# Patient Record
Sex: Female | Born: 1990 | Race: White | Marital: Married
Health system: Southern US, Community
[De-identification: ages and names within clinical notes are randomized; demographics above are authoritative.]

## PROBLEM LIST (undated history)

## (undated) DIAGNOSIS — J45909 Unspecified asthma, uncomplicated: Secondary | ICD-10-CM

## (undated) DIAGNOSIS — Z9889 Other specified postprocedural states: Secondary | ICD-10-CM

## (undated) HISTORY — PX: TYMPANOSTOMY TUBE PLACEMENT: SHX32

## (undated) HISTORY — PX: WISDOM TOOTH EXTRACTION: SHX21

---
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
## 2008-08-21 ENCOUNTER — Encounter: Admission: RE | Admit: 2008-08-21 | Discharge: 2008-08-21 | Payer: Self-pay | Admitting: Sports Medicine

## 2015-12-03 DIAGNOSIS — J302 Other seasonal allergic rhinitis: Secondary | ICD-10-CM | POA: Insufficient documentation

## 2015-12-03 DIAGNOSIS — H902 Conductive hearing loss, unspecified: Secondary | ICD-10-CM | POA: Insufficient documentation

## 2015-12-03 DIAGNOSIS — H6983 Other specified disorders of Eustachian tube, bilateral: Secondary | ICD-10-CM | POA: Insufficient documentation

## 2015-12-24 DIAGNOSIS — H7292 Unspecified perforation of tympanic membrane, left ear: Secondary | ICD-10-CM | POA: Insufficient documentation

## 2016-01-31 ENCOUNTER — Institutional Professional Consult (permissible substitution): Payer: BLUE CROSS/BLUE SHIELD | Admitting: Neurology

## 2016-03-15 DIAGNOSIS — Z6825 Body mass index (BMI) 25.0-25.9, adult: Secondary | ICD-10-CM | POA: Diagnosis not present

## 2016-03-15 DIAGNOSIS — R87612 Low grade squamous intraepithelial lesion on cytologic smear of cervix (LGSIL): Secondary | ICD-10-CM | POA: Diagnosis not present

## 2016-03-15 DIAGNOSIS — Z113 Encounter for screening for infections with a predominantly sexual mode of transmission: Secondary | ICD-10-CM | POA: Diagnosis not present

## 2016-03-15 DIAGNOSIS — Z01419 Encounter for gynecological examination (general) (routine) without abnormal findings: Secondary | ICD-10-CM | POA: Diagnosis not present

## 2016-05-12 DIAGNOSIS — N72 Inflammatory disease of cervix uteri: Secondary | ICD-10-CM | POA: Diagnosis not present

## 2016-05-12 DIAGNOSIS — R87612 Low grade squamous intraepithelial lesion on cytologic smear of cervix (LGSIL): Secondary | ICD-10-CM | POA: Diagnosis not present

## 2016-05-12 DIAGNOSIS — B977 Papillomavirus as the cause of diseases classified elsewhere: Secondary | ICD-10-CM | POA: Diagnosis not present

## 2016-06-13 DIAGNOSIS — J020 Streptococcal pharyngitis: Secondary | ICD-10-CM | POA: Diagnosis not present

## 2017-03-14 ENCOUNTER — Encounter: Payer: Self-pay | Admitting: Adult Health

## 2017-03-14 ENCOUNTER — Ambulatory Visit (INDEPENDENT_AMBULATORY_CARE_PROVIDER_SITE_OTHER): Payer: BLUE CROSS/BLUE SHIELD | Admitting: Adult Health

## 2017-03-14 DIAGNOSIS — Z0000 Encounter for general adult medical examination without abnormal findings: Secondary | ICD-10-CM | POA: Diagnosis not present

## 2017-03-14 DIAGNOSIS — H6690 Otitis media, unspecified, unspecified ear: Secondary | ICD-10-CM | POA: Insufficient documentation

## 2017-03-14 DIAGNOSIS — B029 Zoster without complications: Secondary | ICD-10-CM | POA: Diagnosis not present

## 2017-03-14 MED ORDER — ONDANSETRON 8 MG PO TBDP: 8.0000 mg | ORAL_TABLET | Freq: Three times a day (TID) | ORAL | 0 refills | Status: DC | PRN

## 2017-03-14 MED ORDER — AMOXICILLIN-POT CLAVULANATE 875-125 MG PO TABS: 1.0000 | ORAL_TABLET | Freq: Two times a day (BID) | ORAL | 0 refills | Status: DC

## 2017-03-14 MED ORDER — GABAPENTIN 300 MG PO CAPS: 300.0000 mg | ORAL_CAPSULE | Freq: Two times a day (BID) | ORAL | 1 refills | Status: DC

## 2017-03-14 NOTE — Assessment & Plan Note (Signed)
Gabapentin 300mg  BID PRN for zoster pain. Keep site clean/dry. Discussed s/s of infection-instructed to call clinic if any are noted.

## 2017-03-14 NOTE — Assessment & Plan Note (Addendum)
Need to schedule CPE with fasting labs. Have requested records from previous PCP.

## 2017-03-14 NOTE — Progress Notes (Signed)
Subjective:    Patient ID: Anna [name], female    DOB: 25-Feb-1991, 26 y.o.   MRN: [id number]  HPI:  Anna [name] is here to establish as a new pt.  She is a very pleasant 26 year old female.  PMH: Recurrent AOM, recurrent shingles outbreak.  Both recently began over the weekend.  Left ear pain 7/10, constant dull ache that is worsening.  Herpes zoster vesicles erupted sat night and have stay localized above OD, pain is rated 3/10 and described as "electric pain".  She did have chicken pox as a child and first herpes zoster outbreak occurred during in middle school, then again at age 26.  Since then she has had herpes zoster outbreak 1-2 times/year. She denies hx of immunosupresssive disease or family hx of similar experience. 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  She denies tobacco use or excessive ETOH use.  Patient Care Team    Relationship Specialty Notifications Start End  Anna [name], Anna Ege D, NP PCP - General Family Medicine  03/14/17     Patient Active Problem List   Diagnosis Date Noted  . Health care maintenance 03/14/2017  . Shingles outbreak 03/14/2017  . AOM (acute otitis media) 03/14/2017     No past medical history on file.   No past surgical history on file.   No family history on file.   History  Drug use: Unknown     History  Alcohol use Not on file     History  Smoking Status  . Not on file  Smokeless Tobacco  . Not on file     Outpatient Encounter Prescriptions as of 03/14/2017  Medication Sig  . amoxicillin-clavulanate (AUGMENTIN) 875-125 MG tablet Take 1 tablet by mouth 2 (two) times daily.  Marland Kitchen. gabapentin (NEURONTIN) 300 MG capsule Take 1 capsule (300 mg total) by mouth 2 (two) times daily.  Colleen Can. JUNEL FE 1/20 1-20 MG-MCG tablet Take 1 tablet by mouth daily.  . ondansetron (ZOFRAN-ODT) 8 MG disintegrating tablet Take 1 tablet (8 mg total) by mouth every 8  (eight) hours as needed for nausea or vomiting.   No facility-administered encounter medications on file as of 03/14/2017.     Allergies: Patient has no known allergies.  Body mass index is 25.63 kg/m.  Blood pressure 103/68, pulse 69, height 5\' 4"  (1.626 m), weight 149 lb 4.8 oz (67.7 kg), last menstrual period 02/21/2017.       Review of Systems  Constitutional: Negative for activity change, appetite change, chills, diaphoresis, fatigue, fever and unexpected weight change.  HENT: Positive for ear pain. Negative for congestion, ear discharge, postnasal drip and voice change.   Eyes: Negative for visual disturbance.  Respiratory: Negative for cough, chest tightness, shortness of breath and stridor.   Cardiovascular: Negative for chest pain, palpitations and leg swelling.  Gastrointestinal: Negative for abdominal distention, abdominal pain, anal bleeding, constipation, diarrhea, nausea and vomiting.       Reports nausea when taking Augmentin   Endocrine: Negative for cold intolerance, heat intolerance, polydipsia, polyphagia and polyuria.  Genitourinary: Negative for difficulty urinating.  Musculoskeletal: Negative for arthralgias, back pain, gait problem, joint swelling, myalgias, neck pain and neck stiffness.  Skin: Positive for rash. Negative for color change, pallor and wound.  Allergic/Immunologic: Negative for immunocompromised state.  Neurological: Negative for dizziness, tremors, weakness, light-headedness, numbness and headaches.  Hematological: Does not bruise/bleed easily.  Psychiatric/Behavioral: Negative for dysphoric mood.  The patient is not nervous/anxious.        Objective:   Physical Exam  Constitutional: She is oriented to person, place, and time. She appears well-developed and well-nourished. No distress.  HENT:  Head: Normocephalic and atraumatic.  Right Ear: Hearing, external ear and ear canal normal. Tympanic membrane is not erythematous and not bulging.  No decreased hearing is noted.  Left Ear: Hearing and external ear normal. Tympanic membrane is erythematous and bulging. No decreased hearing is noted.  Nose: Right sinus exhibits no maxillary sinus tenderness and no frontal sinus tenderness. Left sinus exhibits frontal sinus tenderness. Left sinus exhibits no maxillary sinus tenderness.  Mouth/Throat: Uvula is midline.  Eyes: Conjunctivae are normal. Pupils are equal, round, and reactive to light.  Neck: Normal range of motion. Neck supple.  Cardiovascular: Normal rate, regular rhythm, normal heart sounds and intact distal pulses.   No murmur heard. Pulmonary/Chest: Effort normal and breath sounds normal. No respiratory distress. She has no wheezes. She has no rales. She exhibits no tenderness.  Lymphadenopathy:    She has no cervical adenopathy.  Neurological: She is alert and oriented to person, place, and time. Coordination normal.  Skin: Skin is warm and dry. Rash noted. Rash is vesicular. She is not diaphoretic. No erythema. No pallor.     No drainage or open tissue noted. No streaking noted.  Psychiatric: She has a normal mood and affect. Her behavior is normal. Judgment and thought content normal.  Nursing note and vitals reviewed.         Assessment & Plan:   1. Health care maintenance   2. Herpes zoster without complication     Health care maintenance Need to schedule CPE with fasting labs. Have requested records from previous PCP.  AOM (acute otitis media) Augmentin x 10 [name] course. Alternate OTC Acetaminophen/Ibuprofen for pain. Has seen ENT multiple times and declines referral today.  Shingles outbreak Gabapentin 300mg  BID PRN for zoster pain. Keep site clean/dry. Discussed s/s of infection-instructed to call clinic if any are noted.    FOLLOW-UP:  Return in about 1 year (around 03/14/2018) for Regular Follow Up.

## 2017-03-14 NOTE — Assessment & Plan Note (Signed)
Augmentin x 10 d course. Alternate OTC Acetaminophen/Ibuprofen for pain. Has seen ENT multiple times and declines referral today.

## 2017-03-14 NOTE — Patient Instructions (Signed)
Shingles °Shingles, which is also known as herpes zoster, is an infection that causes a painful skin rash and fluid-filled blisters. Shingles is not related to genital herpes, which is a sexually transmitted infection. °Shingles only develops in people who: °· Have had chickenpox. °· Have received the chickenpox vaccine. (This is rare.) °What are the causes? °Shingles is caused by varicella-zoster virus (VZV). This is the same virus that causes chickenpox. After exposure to VZV, the virus stays in the body in an inactive (dormant) state. Shingles develops if the virus reactivates. This can happen many years after the initial exposure to VZV. It is not known what causes this virus to reactivate. °What increases the risk? °People who have had chickenpox or received the chickenpox vaccine are at risk for shingles. Infection is more common in people who: °· Are older than age 50. °· Have a weakened defense (immune) system, such as those with HIV, AIDS, or cancer. °· Are taking medicines that weaken the immune system, such as transplant medicines. °· Are under great stress. °What are the signs or symptoms? °Early symptoms of this condition include itching, tingling, and pain in an area on your skin. Pain may be described as burning, stabbing, or throbbing. °A few days or weeks after symptoms start, a painful red rash appears, usually on one side of the body in a bandlike or beltlike pattern. The rash eventually turns into fluid-filled blisters that break open, scab over, and dry up in about 2-3 weeks. °At any time during the infection, you may also develop: °· A fever. °· Chills. °· A headache. °· An upset stomach. °How is this diagnosed? °This condition is diagnosed with a skin exam. Sometimes, skin or fluid samples are taken from the blisters before a diagnosis is made. These samples are examined under a microscope or sent to a lab for testing. °How is this treated? °There is no specific cure for this condition. Your  health care provider will probably prescribe medicines to help you manage pain, recover more quickly, and avoid long-term problems. Medicines may include: °· Antiviral drugs. °· Anti-inflammatory drugs. °· Pain medicines. °If the area involved is on your face, you may be referred to a specialist, such as an eye doctor (ophthalmologist) or an ear, nose, and throat (ENT) doctor to help you avoid eye problems, chronic pain, or disability. °Follow these instructions at home: °Medicines  °· Take medicines only as directed by your health care provider. °· Apply an anti-itch or numbing cream to the affected area as directed by your health care provider. °Blister and Rash Care  °· Take a cool bath or apply cool compresses to the area of the rash or blisters as directed by your health care provider. This may help with pain and itching. °· Keep your rash covered with a loose bandage (dressing). Wear loose-fitting clothing to help ease the pain of material rubbing against the rash. °· Keep your rash and blisters clean with mild soap and cool water or as directed by your health care provider. °· Check your rash every day for signs of infection. These include redness, swelling, and pain that lasts or increases. °· Do not pick your blisters. °· Do not scratch your rash. °General instructions  °· Rest as directed by your health care provider. °· Keep all follow-up visits as directed by your health care provider. This is important. °· Until your blisters scab over, your infection can cause chickenpox in people who have never had it or been vaccinated against   it. To prevent this from happening, avoid contact with other people, especially:  Babies.  Pregnant women.  Children who have eczema.  Elderly people who have transplants.  People who have chronic illnesses, such as leukemia or AIDS. Contact a health care provider if:  Your pain is not relieved with prescribed medicines.  Your pain does not get better after the  rash heals.  Your rash looks infected. Signs of infection include redness, swelling, and pain that lasts or increases. Get help right away if:  The rash is on your face or nose.  You have facial pain, pain around your eye area, or loss of feeling on one side of your face.  You have ear pain or you have ringing in your ear.  You have loss of taste.  Your condition gets worse. This information is not intended to replace advice given to you by your health care provider. Make sure you discuss any questions you have with your health care provider. Document Released: 10/02/2005 Document Revised: 05/28/2016 Document Reviewed: 08/13/2014 Elsevier Interactive Patient Education  2017 [employer]  Please take all medications as directed. Do not drive, work, drink alcohol when taking Gabapentin. Annual Follow-up, sooner if needed. [address] [place]  [address] [place] [address] [phone] NICE TO MEET YOU!

## 2017-03-22 DIAGNOSIS — Z1329 Encounter for screening for other suspected endocrine disorder: Secondary | ICD-10-CM | POA: Diagnosis not present

## 2017-03-22 DIAGNOSIS — Z6825 Body mass index (BMI) 25.0-25.9, adult: Secondary | ICD-10-CM | POA: Diagnosis not present

## 2017-03-22 DIAGNOSIS — Z3200 Encounter for pregnancy test, result unknown: Secondary | ICD-10-CM | POA: Diagnosis not present

## 2017-03-22 DIAGNOSIS — Z01419 Encounter for gynecological examination (general) (routine) without abnormal findings: Secondary | ICD-10-CM | POA: Diagnosis not present

## 2017-04-03 DIAGNOSIS — R87612 Low grade squamous intraepithelial lesion on cytologic smear of cervix (LGSIL): Secondary | ICD-10-CM | POA: Diagnosis not present

## 2017-04-03 DIAGNOSIS — Z124 Encounter for screening for malignant neoplasm of cervix: Secondary | ICD-10-CM | POA: Diagnosis not present

## 2017-05-02 ENCOUNTER — Telehealth: Payer: Self-pay | Admitting: Adult Health

## 2017-05-02 NOTE — Telephone Encounter (Signed)
[facility] called [facility] & was given Rx-- Patient is requesting provider/ MA contact regarding treatment of this outbreak, says will come in for a visit/ Appt if required. --glh

## 2017-05-02 NOTE — Telephone Encounter (Signed)
Shingles outbreak over eyebrow.  Patient has had a death in her family and has to go out of town.  Patient is experiencing a burning sensation.  She has blisters in her eyebrow.  They just showed up over night.  Patient is unsure what she has taken in the past.  Spoke with [facility] and she would like patient to be seen.  Offered patient morning appointment but patient refused.  Advised patient we would need to see the outbreak in order to treat.  Patient was unhappy about having to make an appointment and said she would just go to urgent care.

## 2017-05-16 DIAGNOSIS — M9904 Segmental and somatic dysfunction of sacral region: Secondary | ICD-10-CM | POA: Diagnosis not present

## 2017-05-16 DIAGNOSIS — M9902 Segmental and somatic dysfunction of thoracic region: Secondary | ICD-10-CM | POA: Diagnosis not present

## 2017-05-16 DIAGNOSIS — M5386 Other specified dorsopathies, lumbar region: Secondary | ICD-10-CM | POA: Diagnosis not present

## 2017-05-16 DIAGNOSIS — M9903 Segmental and somatic dysfunction of lumbar region: Secondary | ICD-10-CM | POA: Diagnosis not present

## 2017-05-19 DIAGNOSIS — M9902 Segmental and somatic dysfunction of thoracic region: Secondary | ICD-10-CM | POA: Diagnosis not present

## 2017-05-19 DIAGNOSIS — M9904 Segmental and somatic dysfunction of sacral region: Secondary | ICD-10-CM | POA: Diagnosis not present

## 2017-05-19 DIAGNOSIS — M9903 Segmental and somatic dysfunction of lumbar region: Secondary | ICD-10-CM | POA: Diagnosis not present

## 2017-05-19 DIAGNOSIS — M5386 Other specified dorsopathies, lumbar region: Secondary | ICD-10-CM | POA: Diagnosis not present

## 2017-08-22 DIAGNOSIS — N390 Urinary tract infection, site not specified: Secondary | ICD-10-CM | POA: Diagnosis not present

## 2018-01-30 ENCOUNTER — Encounter: Payer: Self-pay | Admitting: Adult Health

## 2018-01-30 ENCOUNTER — Ambulatory Visit (INDEPENDENT_AMBULATORY_CARE_PROVIDER_SITE_OTHER): Payer: BLUE CROSS/BLUE SHIELD | Admitting: Adult Health

## 2018-01-30 VITALS — BP 114/78 | HR 72 | Ht 64.0 in | Wt 147.0 lb

## 2018-01-30 DIAGNOSIS — B029 Zoster without complications: Secondary | ICD-10-CM

## 2018-01-30 MED ORDER — VALACYCLOVIR HCL 1 G PO TABS: 1000.0000 mg | ORAL_TABLET | Freq: Three times a day (TID) | ORAL | 1 refills | Status: DC

## 2018-01-30 MED ORDER — GABAPENTIN 300 MG PO CAPS: 300.0000 mg | ORAL_CAPSULE | Freq: Two times a day (BID) | ORAL | 1 refills | Status: DC

## 2018-01-30 NOTE — Progress Notes (Signed)
Subjective:    Patient ID: Anna [name], female    DOB: 01-04-1991, 27 y.o.   MRN: [id number]  HPI: Anna [name] presents with red rash above OD that developed this morning. She reports a dull burning sensation, rated 3/10.  She denies changes in vision.   She had chicken pox as a child and has had recurrent shingles outbreak since middle school.  She estimates that this is her 6113th or 14th outbreak. She denies CP/dyspnea/palptations/N/V/D  Patient Care Team    Relationship Specialty Notifications Start End  Julaine Fusianford, Nehal Witting D, NP PCP - General Family Medicine  03/14/17     Patient Active Problem List   Diagnosis Date Noted  . Health care maintenance 03/14/2017  . Shingles outbreak 03/14/2017  . AOM (acute otitis media) 03/14/2017     History reviewed. No pertinent past medical history.   History reviewed. No pertinent surgical history.   History reviewed. No pertinent family history.   Social History   Substance and Sexual Activity  Drug Use Not on file     Social History   Substance and Sexual Activity  Alcohol Use Not on file     Social History   Tobacco Use  Smoking Status Not on file     Outpatient Encounter Medications as of 01/30/2018  Medication Sig  . JUNEL FE 1/20 1-20 MG-MCG tablet Take 1 tablet by mouth daily.  Marland Kitchen. gabapentin (NEURONTIN) 300 MG capsule Take 1 capsule (300 mg total) by mouth 2 (two) times daily.  . valACYclovir (VALTREX) 1000 MG tablet Take 1 tablet (1,000 mg total) by mouth 3 (three) times daily.  . [DISCONTINUED] amoxicillin-clavulanate (AUGMENTIN) 875-125 MG tablet Take 1 tablet by mouth 2 (two) times daily.  . [DISCONTINUED] gabapentin (NEURONTIN) 300 MG capsule Take 1 capsule (300 mg total) by mouth 2 (two) times daily.  . [DISCONTINUED] ondansetron (ZOFRAN-ODT) 8 MG disintegrating tablet Take 1 tablet (8 mg total) by mouth every 8 (eight) hours as needed for nausea or vomiting.   No facility-administered encounter  medications on file as of 01/30/2018.     Allergies: Patient has no known allergies.  Body mass index is 25.23 kg/m.  Blood pressure 114/78, pulse 72, height 5\' 4"  (1.626 m), weight 147 lb (66.7 kg), last menstrual period 01/22/2018, SpO2 100 %.      Review of Systems  Constitutional: Positive for fatigue. Negative for activity change, appetite change, chills, diaphoresis, fever and unexpected weight change.  Eyes: Negative for photophobia, pain, discharge, redness, itching and visual disturbance.  Skin: Positive for color change and rash. Negative for pallor and wound.  Neurological: Negative for dizziness and headaches.  Psychiatric/Behavioral: Negative for confusion, decreased concentration, dysphoric mood, hallucinations, self-injury, sleep disturbance and suicidal ideas. The patient is not nervous/anxious and is not hyperactive.        Objective:   Physical Exam  Constitutional: She is oriented to person, place, and time. She appears well-developed and well-nourished. No distress.  HENT:  Head: Normocephalic and atraumatic.  Right Ear: External ear normal. Tympanic membrane is bulging. Tympanic membrane is not erythematous. No decreased hearing is noted.  Left Ear: External ear normal. Tympanic membrane is bulging. Tympanic membrane is not erythematous. No decreased hearing is noted.  Nose: Nose normal.  Mouth/Throat: Uvula is midline, oropharynx is clear and moist and mucous membranes are normal.  Eyes: Pupils are equal, round, and reactive to light. Conjunctivae, EOM and lids are normal.  Cardiovascular: Normal rate, regular rhythm, normal heart sounds and intact  distal pulses.  No murmur heard. Pulmonary/Chest: Effort normal and breath sounds normal. No respiratory distress. She has no wheezes. She has no rales. She exhibits no tenderness.  Neurological: She is alert and oriented to person, place, and time.  Skin: Skin is warm and dry. Rash noted. She is not diaphoretic.  No erythema. No pallor.  Shingles outbreak above OD OD not involved  Psychiatric: She has a normal mood and affect. Her behavior is normal. Judgment and thought content normal.      Assessment & Plan:   1. Herpes zoster without complication     Shingles outbreak Outbreak above OD OD not involved, instructed to call clinic if rash reaches eye and we will refer to Ophthalmologist STAT Valacyclovir 1g TID Gabapentin 300mg  BID PRN burning pain Refills provided on each so she can treat if another outbreak occurs prior to CPE in 6 months    FOLLOW-UP:  Return in about 6 months (around 08/01/2018), or if symptoms worsen or fail to improve, for Regular Follow Up.

## 2018-01-30 NOTE — Assessment & Plan Note (Addendum)
Outbreak above OD OD not involved, instructed to call clinic if rash reaches eye and we will refer to Ophthalmologist STAT Valacyclovir 1g TID Gabapentin 300mg  BID PRN burning pain Refills provided on each so she can treat if another outbreak occurs prior to CPE in 6 months

## 2018-01-30 NOTE — Patient Instructions (Addendum)
Shingles Shingles, which is also known as herpes zoster, is an infection that causes a painful skin rash and fluid-filled blisters. Shingles is not related to genital herpes, which is a sexually transmitted infection. Shingles only develops in people who:  Have had chickenpox.  Have received the chickenpox vaccine. (This is rare.)  What are the causes? Shingles is caused by varicella-zoster virus (VZV). This is the same virus that causes chickenpox. After exposure to VZV, the virus stays in the body in an inactive (dormant) state. Shingles develops if the virus reactivates. This can happen many years after the initial exposure to VZV. It is not known what causes this virus to reactivate. What increases the risk? People who have had chickenpox or received the chickenpox vaccine are at risk for shingles. Infection is more common in people who:  Are older than age 50.  Have a weakened defense (immune) system, such as those with HIV, AIDS, or cancer.  Are taking medicines that weaken the immune system, such as transplant medicines.  Are under great stress.  What are the signs or symptoms? Early symptoms of this condition include itching, tingling, and pain in an area on your skin. Pain may be described as burning, stabbing, or throbbing. A few days or weeks after symptoms start, a painful red rash appears, usually on one side of the body in a bandlike or beltlike pattern. The rash eventually turns into fluid-filled blisters that break open, scab over, and dry up in about 2-3 weeks. At any time during the infection, you may also develop:  A fever.  Chills.  A headache.  An upset stomach.  How is this diagnosed? This condition is diagnosed with a skin exam. Sometimes, skin or fluid samples are taken from the blisters before a diagnosis is made. These samples are examined under a microscope or sent to a lab for testing. How is this treated? There is no specific cure for this condition.  Your health care provider will probably prescribe medicines to help you manage pain, recover more quickly, and avoid long-term problems. Medicines may include:  Antiviral drugs.  Anti-inflammatory drugs.  Pain medicines.  If the area involved is on your face, you may be referred to a specialist, such as an eye doctor (ophthalmologist) or an ear, nose, and throat (ENT) doctor to help you avoid eye problems, chronic pain, or disability. Follow these instructions at home: Medicines  Take medicines only as directed by your health care provider.  Apply an anti-itch or numbing cream to the affected area as directed by your health care provider. Blister and Rash Care  Take a cool bath or apply cool compresses to the area of the rash or blisters as directed by your health care provider. This may help with pain and itching.  Keep your rash covered with a loose bandage (dressing). Wear loose-fitting clothing to help ease the pain of material rubbing against the rash.  Keep your rash and blisters clean with mild soap and cool water or as directed by your health care provider.  Check your rash every day for signs of infection. These include redness, swelling, and pain that lasts or increases.  Do not pick your blisters.  Do not scratch your rash. General instructions  Rest as directed by your health care provider.  Keep all follow-up visits as directed by your health care provider. This is important.  Until your blisters scab over, your infection can cause chickenpox in people who have never had it or been vaccinated   against it. To prevent this from happening, avoid contact with other people, especially: ? Babies. ? Pregnant women. ? Children who have eczema. ? Elderly people who have transplants. ? People who have chronic illnesses, such as leukemia or AIDS. Contact a health care provider if:  Your pain is not relieved with prescribed medicines.  Your pain does not get better after  the rash heals.  Your rash looks infected. Signs of infection include redness, swelling, and pain that lasts or increases. Get help right away if:  The rash is on your face or nose.  You have facial pain, pain around your eye area, or loss of feeling on one side of your face.  You have ear pain or you have ringing in your ear.  You have loss of taste.  Your condition gets worse. This information is not intended to replace advice given to you by your health care provider. Make sure you discuss any questions you have with your health care provider. Document Released: 10/02/2005 Document Revised: 05/28/2016 Document Reviewed: 08/13/2014 Elsevier Interactive Patient Education  2018 [employer]   Please use Valacyclovir as directed and Gabapentin as needed. We will check on whether you are a candidate for shingles vaccine. Recommend OTC antihistamine for seasonal allergies. Follow-up in 6 months for complete physical with fasting labs. FEEL BETTER!

## 2018-05-23 ENCOUNTER — Other Ambulatory Visit: Payer: Self-pay | Admitting: Adult Health

## 2018-05-23 ENCOUNTER — Telehealth: Payer: Self-pay | Admitting: Adult Health

## 2018-05-23 ENCOUNTER — Ambulatory Visit: Payer: BLUE CROSS/BLUE SHIELD | Admitting: Adult Health

## 2018-05-23 ENCOUNTER — Encounter: Payer: Self-pay | Admitting: Adult Health

## 2018-05-23 MED ORDER — BACITRACIN 500 UNIT/GM EX OINT: 1.0000 "application " | TOPICAL_OINTMENT | Freq: Two times a day (BID) | CUTANEOUS | 0 refills | Status: DC

## 2018-05-23 MED ORDER — VALACYCLOVIR HCL 1 G PO TABS: 1000.0000 mg | ORAL_TABLET | Freq: Three times a day (TID) | ORAL | 2 refills | Status: DC

## 2018-05-23 NOTE — Telephone Encounter (Signed)
Patient returned medical assistant's call.  --[place] called.  --glh

## 2018-06-24 DIAGNOSIS — Z6826 Body mass index (BMI) 26.0-26.9, adult: Secondary | ICD-10-CM | POA: Diagnosis not present

## 2018-06-24 DIAGNOSIS — Z01419 Encounter for gynecological examination (general) (routine) without abnormal findings: Secondary | ICD-10-CM | POA: Diagnosis not present

## 2018-06-24 DIAGNOSIS — R87612 Low grade squamous intraepithelial lesion on cytologic smear of cervix (LGSIL): Secondary | ICD-10-CM | POA: Diagnosis not present

## 2018-07-15 ENCOUNTER — Other Ambulatory Visit: Payer: BLUE CROSS/BLUE SHIELD

## 2018-07-18 ENCOUNTER — Encounter: Payer: BLUE CROSS/BLUE SHIELD | Admitting: Adult Health

## 2018-11-12 ENCOUNTER — Encounter: Payer: Self-pay | Admitting: Adult Health

## 2018-11-12 ENCOUNTER — Other Ambulatory Visit: Payer: Self-pay | Admitting: Adult Health

## 2018-11-12 MED ORDER — VALACYCLOVIR HCL 1 G PO TABS: 1000.0000 mg | ORAL_TABLET | Freq: Three times a day (TID) | ORAL | 2 refills | Status: DC

## 2019-02-09 ENCOUNTER — Encounter: Payer: Self-pay | Admitting: Adult Health

## 2019-03-11 ENCOUNTER — Encounter: Payer: Self-pay | Admitting: Adult Health

## 2019-03-11 ENCOUNTER — Other Ambulatory Visit: Payer: Self-pay | Admitting: Adult Health

## 2019-03-11 DIAGNOSIS — B0239 Other herpes zoster eye disease: Secondary | ICD-10-CM

## 2019-03-11 MED ORDER — GABAPENTIN 300 MG PO CAPS: 300.0000 mg | ORAL_CAPSULE | Freq: Two times a day (BID) | ORAL | 1 refills | Status: DC

## 2019-03-11 MED ORDER — ONDANSETRON 4 MG PO TBDP: 4.0000 mg | ORAL_TABLET | Freq: Three times a day (TID) | ORAL | 0 refills | Status: DC | PRN

## 2019-03-11 MED ORDER — VALACYCLOVIR HCL 1 G PO TABS: 1000.0000 mg | ORAL_TABLET | Freq: Three times a day (TID) | ORAL | 2 refills | Status: DC

## 2019-03-12 ENCOUNTER — Other Ambulatory Visit: Payer: Self-pay

## 2019-03-12 DIAGNOSIS — Z0000 Encounter for general adult medical examination without abnormal findings: Secondary | ICD-10-CM

## 2019-03-13 ENCOUNTER — Other Ambulatory Visit: Payer: Self-pay

## 2019-03-13 ENCOUNTER — Other Ambulatory Visit: Payer: BLUE CROSS/BLUE SHIELD

## 2019-03-13 DIAGNOSIS — Z0000 Encounter for general adult medical examination without abnormal findings: Secondary | ICD-10-CM

## 2019-03-14 LAB — CBC WITH DIFFERENTIAL/PLATELET
Basophils Absolute: 0 10*3/uL (ref 0.0–0.2)
Basos: 0 %
EOS (ABSOLUTE): 0.1 10*3/uL (ref 0.0–0.4)
Eos: 2 %
Hematocrit: 39.5 % (ref 34.0–46.6)
Hemoglobin: 13.5 g/dL (ref 11.1–15.9)
Immature Grans (Abs): 0 10*3/uL (ref 0.0–0.1)
Immature Granulocytes: 0 %
Lymphocytes Absolute: 1.5 10*3/uL (ref 0.7–3.1)
Lymphs: 41 %
MCH: 32.8 pg (ref 26.6–33.0)
MCHC: 34.2 g/dL (ref 31.5–35.7)
MCV: 96 fL (ref 79–97)
Monocytes Absolute: 0.3 10*3/uL (ref 0.1–0.9)
Monocytes: 8 %
Neutrophils Absolute: 1.8 10*3/uL (ref 1.4–7.0)
Neutrophils: 49 %
Platelets: 160 10*3/uL (ref 150–450)
RBC: 4.11 x10E6/uL (ref 3.77–5.28)
RDW: 12.3 % (ref 11.7–15.4)
WBC: 3.7 10*3/uL (ref 3.4–10.8)

## 2019-03-14 LAB — LIPID PANEL
Chol/HDL Ratio: 2.4 ratio (ref 0.0–4.4)
Cholesterol, Total: 131 mg/dL (ref 100–199)
HDL: 55 mg/dL (ref >39)
LDL Calculated: 59 mg/dL (ref 0–99)
Triglycerides: 85 mg/dL (ref 0–149)
VLDL Cholesterol Cal: 17 mg/dL (ref 5–40)

## 2019-03-14 LAB — COMPREHENSIVE METABOLIC PANEL
ALT: 14 IU/L (ref 0–32)
AST: 17 IU/L (ref 0–40)
Albumin/Globulin Ratio: 2.2 (ref 1.2–2.2)
Albumin: 4.6 g/dL (ref 3.9–5.0)
Alkaline Phosphatase: 39 IU/L (ref 39–117)
BUN/Creatinine Ratio: 16 (ref 9–23)
BUN: 15 mg/dL (ref 6–20)
Bilirubin Total: 0.5 mg/dL (ref 0.0–1.2)
CO2: 24 mmol/L (ref 20–29)
Calcium: 9.2 mg/dL (ref 8.7–10.2)
Chloride: 102 mmol/L (ref 96–106)
Creatinine, Ser: 0.93 mg/dL (ref 0.57–1.00)
GFR calc Af Amer: 96 mL/min/{1.73_m2} (ref >59)
GFR calc non Af Amer: 83 mL/min/{1.73_m2} (ref >59)
Globulin, Total: 2.1 g/dL (ref 1.5–4.5)
Glucose: 81 mg/dL (ref 65–99)
Potassium: 4.5 mmol/L (ref 3.5–5.2)
Sodium: 140 mmol/L (ref 134–144)
Total Protein: 6.7 g/dL (ref 6.0–8.5)

## 2019-03-14 LAB — TSH: TSH: 1.98 u[IU]/mL (ref 0.450–4.500)

## 2019-03-14 LAB — HEMOGLOBIN A1C
Est. average glucose Bld gHb Est-mCnc: 97 mg/dL
Hgb A1c MFr Bld: 5 % (ref 4.8–5.6)

## 2019-08-16 DIAGNOSIS — Z20828 Contact with and (suspected) exposure to other viral communicable diseases: Secondary | ICD-10-CM | POA: Diagnosis not present

## 2019-08-21 DIAGNOSIS — Z01419 Encounter for gynecological examination (general) (routine) without abnormal findings: Secondary | ICD-10-CM | POA: Diagnosis not present

## 2019-08-21 DIAGNOSIS — Z124 Encounter for screening for malignant neoplasm of cervix: Secondary | ICD-10-CM | POA: Diagnosis not present

## 2019-08-21 DIAGNOSIS — Z6822 Body mass index (BMI) 22.0-22.9, adult: Secondary | ICD-10-CM | POA: Diagnosis not present

## 2019-11-19 ENCOUNTER — Telehealth: Payer: Self-pay | Admitting: Adult Health

## 2019-11-19 ENCOUNTER — Other Ambulatory Visit: Payer: Self-pay | Admitting: Adult Health

## 2019-11-19 ENCOUNTER — Encounter: Payer: Self-pay | Admitting: Adult Health

## 2019-11-19 NOTE — Telephone Encounter (Signed)
Patient called states pharmacy says Rx is being "Held" for provider approval-- pt wants to know why (she is having a breakout & needs Rx)  :  valACYclovir (VALTREX) 1000 MG tablet [[id number]]   Order Details Dose: 1,000 mg Route: Oral Frequency: 3 times daily  Dispense Quantity: 21 tablet Refills: 2   Indications of Use: Herpes Zoster Infection      Sig: Take 1 tablet (1,000 mg total) by mouth 3 (three) times daily.      Start Date: 03/11/19 End Date: --   ---Forwarding request to med asst for review w/ provider, if approved send to:   [id number] [place], [place] - [address] AT [facility] OF [facility] [phone] (Phone) [phone] (Fax  --glh

## 2019-11-20 ENCOUNTER — Other Ambulatory Visit: Payer: Self-pay | Admitting: Adult Health

## 2019-11-20 MED ORDER — VALACYCLOVIR HCL 1 G PO TABS: 1000.0000 mg | ORAL_TABLET | Freq: Three times a day (TID) | ORAL | 1 refills | Status: DC

## 2019-11-24 ENCOUNTER — Ambulatory Visit (INDEPENDENT_AMBULATORY_CARE_PROVIDER_SITE_OTHER): Payer: BC Managed Care – PPO | Admitting: Adult Health

## 2019-11-24 ENCOUNTER — Encounter: Payer: Self-pay | Admitting: Adult Health

## 2019-11-24 VITALS — Wt 139.0 lb

## 2019-11-24 DIAGNOSIS — F439 Reaction to severe stress, unspecified: Secondary | ICD-10-CM | POA: Diagnosis not present

## 2019-11-24 DIAGNOSIS — F419 Anxiety disorder, unspecified: Secondary | ICD-10-CM | POA: Diagnosis not present

## 2019-11-24 MED ORDER — FLUOXETINE HCL 10 MG PO TABS: 10.0000 mg | ORAL_TABLET | Freq: Every day | ORAL | 0 refills | Status: DC

## 2019-11-24 MED ORDER — FLUOXETINE HCL 10 MG PO TABS: ORAL_TABLET | ORAL | 0 refills | Status: DC

## 2019-11-24 NOTE — Assessment & Plan Note (Signed)
Assessment and Plan: Referral to BH/psychology placed. Increase regular exercise- at least 3 times per week. Remain well hydrated, follow heart healthy diet. Fluoxetine 10mg - 1/2 tab QD for one week, then increase to full tab. F/u 4 weeks telemedicine  Follow Up Instructions: F/u 4 weeks telemedicine    I discussed the assessment and treatment plan with the patient. The patient was provided an opportunity to ask questions and all were answered. The patient agreed with the plan and demonstrated an understanding of the instructions.   The patient was advised to call back or seek an in-person evaluation if the symptoms worsen or if the condition fails to improve as anticipated

## 2019-11-24 NOTE — Progress Notes (Signed)
Virtual Visit via Telephone Note  I connected with [name] on 11/24/19 at  2:30 PM EST by telephone and verified that I am speaking with the correct person using two identifiers.  Location: Patient:Home Provider:In Clinic   I discussed the limitations, risks, security and privacy concerns of performing an evaluation and management service by telephone and the availability of in person appointments. I also discussed with the patient that there may be a patient responsible charge related to this service. The patient expressed understanding and agreed to proceed.   History of Present Illness: [name] calls in with increased levels of stress. 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 She recently competed in body building competition. She reports increase in "shingles" flare-up, she estimates 4-6 times per year. She has been drinking 2 glasses of wine 2-3 per week. Due to hectic schedule, has only been able to exercise 4 times since her fitness competition on 09/27/2019- which this has caused increased levels of anxiety/stress. She is agreeable to referral to BH/psychology. She has never been on SSRI/SNRI in past. She denies SI/HI PDMP reviewed- no rx's in system the last 2 years  Depression screen [facility] 2/9 11/24/2019 01/30/2018  Decreased Interest 1 0  Down, Depressed, Hopeless 0 0  PHQ - 2 Score 1 0  Altered sleeping 1 2  Tired, decreased energy 1 1  Change in appetite 0 0  Feeling bad or failure about yourself  0 0  Trouble concentrating 0 1  Moving slowly or fidgety/restless 1 0  Suicidal thoughts 0 0  PHQ-9 Score 4 4  Difficult doing work/chores Not difficult at all Not difficult at all    PDMP reviewed- no rx's listed for last 2 yrs- as far as look back allows.  Patient Care Team    Relationship Specialty Notifications Start End  Julaine Fusi, NP  PCP - General Family Medicine  03/14/17     Patient Active Problem List   Diagnosis Date Noted  . Health care maintenance 03/14/2017  . Shingles outbreak 03/14/2017  . AOM (acute otitis media) 03/14/2017     History reviewed. No pertinent past medical history.   History reviewed. No pertinent surgical history.   History reviewed. No pertinent family history.   Social History   Substance and Sexual Activity  Drug Use Not on file     Social History   Substance and Sexual Activity  Alcohol Use None     Social History   Tobacco Use  Smoking Status Not on file     Outpatient Encounter Medications as of 11/24/2019  Medication Sig  . gabapentin (NEURONTIN) 300 MG capsule Take 1 capsule (300 mg total) by mouth 2 (two) times daily.  Colleen Can FE 1/20 1-20 MG-MCG tablet Take 1 tablet by mouth daily.  . ondansetron (ZOFRAN-ODT) 4 MG disintegrating tablet Take 1 tablet (4 mg total) by mouth every 8 (eight) hours as needed for nausea or vomiting.  . valACYclovir (VALTREX) 1000 MG tablet Take 1 tablet (1,000 mg total) by mouth 3 (three) times daily.  Marland Kitchen FLUoxetine (PROZAC) 10 MG tablet 1/2 tablet once daily, increase to one full tablet once daily.  . [DISCONTINUED] bacitracin 500 UNIT/GM ointment Apply 1 application topically 2 (two) times daily.  . [DISCONTINUED] FLUoxetine (PROZAC) 10 MG tablet Take 1 tablet (10 mg total) by mouth daily.   No facility-administered encounter medications on file  as of 11/24/2019.    Allergies: Patient has no known allergies.  Body mass index is 23.86 kg/m.  Weight 139 lb (63 kg), last menstrual period 10/20/2019. Review of Systems: General:   Denies fever, chills, unexplained weight loss.  Optho/Auditory:   Denies visual changes, blurred vision/LOV Respiratory:   Denies SOB, DOE more than baseline levels.  Cardiovascular:   Denies chest pain, palpitations, new onset peripheral edema  Gastrointestinal:   Denies nausea, vomiting, diarrhea.   Genitourinary: Denies dysuria, freq/ urgency, flank pain or discharge from genitals.  Endocrine:     Denies hot or cold intolerance, polyuria, polydipsia. Musculoskeletal:   Denies unexplained myalgias, joint swelling, unexplained arthralgias, gait problems.  Skin:  Denies rash, suspicious lesions Neurological:     Denies dizziness, unexplained weakness, numbness  Psychiatric/Behavioral:   Denies suicidal or homicidal ideations, hallucinations. Stress +    Observations/Objective: No acute distress noted during telephone conversation.  Assessment and Plan: Referral to BH/psychology placed. Increase regular exercise- at least 3 times per week. Remain well hydrated, follow heart healthy diet. Fluoxetine 10mg - 1/2 tab QD for one week, then increase to full tab. F/u 4 weeks telemedicine  Follow Up Instructions: F/u 4 weeks telemedicine    I discussed the assessment and treatment plan with the patient. The patient was provided an opportunity to ask questions and all were answered. The patient agreed with the plan and demonstrated an understanding of the instructions.   The patient was advised to call back or seek an in-person evaluation if the symptoms worsen or if the condition fails to improve as anticipated.  I provided 20 minutes of non-face-to-face time during this encounter.   [name], NP

## 2019-12-22 ENCOUNTER — Other Ambulatory Visit: Payer: Self-pay | Admitting: Adult Health

## 2019-12-23 ENCOUNTER — Other Ambulatory Visit: Payer: Self-pay

## 2019-12-23 ENCOUNTER — Ambulatory Visit (INDEPENDENT_AMBULATORY_CARE_PROVIDER_SITE_OTHER): Payer: BC Managed Care – PPO | Admitting: Family Medicine

## 2019-12-23 ENCOUNTER — Encounter: Payer: Self-pay | Admitting: Family Medicine

## 2019-12-23 VITALS — Ht 64.0 in | Wt 139.2 lb

## 2019-12-23 DIAGNOSIS — F419 Anxiety disorder, unspecified: Secondary | ICD-10-CM | POA: Diagnosis not present

## 2019-12-23 DIAGNOSIS — A630 Anogenital (venereal) warts: Secondary | ICD-10-CM | POA: Insufficient documentation

## 2019-12-23 DIAGNOSIS — F439 Reaction to severe stress, unspecified: Secondary | ICD-10-CM | POA: Diagnosis not present

## 2019-12-23 DIAGNOSIS — Z7189 Other specified counseling: Secondary | ICD-10-CM | POA: Diagnosis not present

## 2019-12-23 MED ORDER — FLUOXETINE HCL 20 MG PO TABS: 20.0000 mg | ORAL_TABLET | Freq: Every day | ORAL | 0 refills | Status: DC

## 2019-12-23 NOTE — Progress Notes (Signed)
Of note, this is my first time meeting patient.  Patient is new to me and was previously being cared for at our office by Anna Hamburger, NP, who no longer works at primary care [employer].    Telehealth office visit note for Anna Anna [name], D.O- at [facility] at [name]   I connected with current patient today and verified that I am speaking with the correct person using two identifiers.   . Location of the patient: Home . Location of the provider: Office Only the patient (+/- their family members at pt's discretion) and myself were participating in the encounter - This visit type was conducted due to national recommendations for restrictions regarding the COVID-19 Pandemic (e.g. social distancing) in an effort to limit this patient's exposure and mitigate transmission in our community.  This format is felt to be most appropriate for this patient at this time.   - No physical exam could be performed with this format, beyond that communicated to [place] by the patient/ family members as noted.   - Additionally my office staff/ schedulers discussed with the patient that there may be a monetary charge related to this service, depending on their medical insurance.   The patient expressed understanding, and agreed to proceed.     _________________________________________________________________________________   History of Present Illness:  I, Anna Anna [name], am serving as Neurosurgeon for Anna Anna [name].  -All old notes from her past visits with prior PCP, Anna Anna [name] were reviewed today.  notes she is doing well today overall.   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  Says "I have a Anna [name] going on."  Reiterates "I don't feel depressed at all, I just feel very stressed."  Denies having panic  For stress relief, patient notes that last OV, Anna Anna [name] told her to go to the gym three times per week.  "I made it a priority over the last couple of weeks to get back in a group gym instead of working out by myself, I feel like I need that human interaction because I'm an extrovert."  She's made it to the gym almost 5 days per week, and states "I try to remember that work will always be here, and it will help me do better work if I'm not as stressed.  States it is helping."  She does CrossFit and typically gets 30 minutes of weight lifting, 30 minutes of cardio approximately.  Says "that part has felt a Anna [name] better."    ---> Regarding referral to [facility], notes "she called and left me a voicemail," but she deferred this due to concerns about her fiance.   - Concerns Regarding her Fiance Says her fiance hasn't slept in three years; "he was diagnosed with a heart condition, and they believe the medication they put him on messes with his sleep as a side-effect."  Reports that he's been to sleep studies and notes for years "  he literally hasn't slept, he might get two hours a night."  Says "his doctor has put him on Zoloft, because he began to become very depressed, and feels like it's never going to get better."    This weekend, states "we had a really bad episode where he was having suicidal thoughts, and we had to take him to the [Anna [name]]."  Patient becomes tearful during this story.  Notes he is established with a counselor, and they suggested it might be best if the patient and her fiance talk to the same therapist, "that way they already have a background."   Notes her  fiance is at the psychiatrist/behavioral health right now working on figuring out how to navigate the situation.   Reports that part of their stress is due to the fact that her fiance owned a gym which was shut down when COVID-19 happened.  Patient notes she has been able to balance two jobs to help her fiance during this time, "but it's just wearing me down emotionally."  Says she doesn't blame him at all and knows she's picked up a Anna [name] of extra stress because of their situation, and "I need to learn how to not sacrifice my wellbeing for his wellbeing."  Says her fiance is 1000% supportive of her concerns.   Notes they are both very spiritual people; she is "deep into prayer and sermons and trying to fill her mind with the peace that God can give, and also a realist with regards to that's why God created doctors and therapists and medicine."  Says "I'm just trying to figure it all out again."   Patient indicates that she has a definite urge to take care of all of her friends and loved ones before she takes care of herself.  Self care is a challenge     GAD 7 : Generalized Anxiety Score 12/23/2019  Nervous, Anxious, on Edge 2  Control/stop worrying 1  Worry too much - different things 1  Trouble relaxing 1  Restless 0  Easily annoyed or irritable 0  Afraid - awful might happen 0  Total GAD 7 Score 5  Anxiety Difficulty Somewhat difficult    Depression screen [facility] Anna [name] 2/9 12/23/2019 11/24/2019 01/30/2018  Decreased Interest 0 1 0  Down, Depressed, Hopeless 0 0 0  PHQ - 2 Score 0 1 0  Altered sleeping 0 1 2  Tired, decreased energy 0 1 1  Change in appetite 0 0 0  Feeling bad or failure about yourself  0 0 0  Trouble concentrating 2 0 1  Moving slowly or fidgety/restless 0 1 0  Suicidal thoughts 0 0 0  PHQ-9 Score 2 4 4   Difficult doing work/chores Somewhat difficult Not difficult at all Not difficult at all      Impression and Recommendations:    1. Stress   2. Anxiety   3.  Counseling on health promotion and disease prevention      - Last OV with provider on 11/24/2019.    Stress, Anxiety with Recent Acute Stressors - Referred to [name], psychology, last OV. - Advised pt to find counselor for herself  - Patient tolerating Prozac 10 mg well without S-E. - Given recent acute stress, discussed increasing dose of Prozac at this time. Which pt desires to do today - Increased dose of Prozac to 20 mg today. - Reviewed side-effects, risks and benefits of medication today.  - Advised patient to follow up with therapist as discussed and recommended. -  As long as therapist is okay with seeing patient and her fiance, advised that joint therapy would be beneficial to manage both the patient's stress and her loved one's stress.  -  In addition to joint therapy, advised patient to go to therapy/counseling solely for herself, to address her own personal concerns in addition to concerns about her fiance.  Discussed that "only you can fix you," and encouraged patient to avoid the urge to fix others.  - In addition to therapy/counseling and prescription intervention, reviewed the "spokes of the wheel" of mood and health management.  Stressed the importance of ongoing prudent habits, including regular exercise, appropriate sleep hygiene, healthful dietary habits, and prayer/meditation to relax.  - Advised patient to begin using a meditation app such as Calm, Breathe, Headspace, Ten Percent.  Advised patient to meditate for 10-15 minutes twice daily.  - Will continue to monitor and modify treatment plan accordingly.    Health Counseling & Preventative Maintenance - Advised patient to continue working toward regular exercise to improve overall mental, physical, and emotional health.    - Encouraged patient to engage in daily physical activity as tolerated, especially a formal exercise routine.  Recommended that the patient eventually strive for at least  150 minutes of moderate cardiovascular activity per week according to guidelines established by the [place] Anna [name].   - Patient should also consume adequate amounts of water.  - Health counseling performed.  All questions answered.   Recommendations - Return in 4-6 weeks for progress on increased dose of Prozac.   - As part of my medical decision making, I reviewed the following data within the [place] History obtained from pt /family, CMA notes reviewed and incorporated if applicable, Labs reviewed, Radiograph/ tests reviewed if applicable and OV notes from prior OV's with me, as well as other specialists she/he has seen since seeing me last, were all reviewed and used in my medical decision making process today.    - Additionally, discussion had with patient regarding our treatment plan, and their biases/concerns about that plan were used in my medical decision making today.    - The patient agreed with the plan and demonstrated an understanding of the instructions.   No barriers to understanding were identified.      Return if symptoms worsen or fail to improve, for f/up in 4-6 weeks for progress on increased dose of Prozac, may be tele-health.     Meds ordered this encounter  Medications  . FLUoxetine (PROZAC) 20 MG tablet    Sig: Take 1 tablet (20 mg total) by mouth daily.    Dispense:  90 tablet    Refill:  0    Medications Discontinued During This Encounter  Medication Reason  . FLUoxetine (PROZAC) 10 MG tablet Dose change     Time spent on visit including pre-visit chart review and post-visit care was 20+ minutes.  Note:  This note was prepared with assistance of Dragon voice recognition software. Occasional wrong-word or sound-a-like substitutions may have occurred due to the inherent limitations of voice recognition software.   The [place] was signed into law in 2016 which includes the topic of electronic health records.  This provides  immediate access to information in MyChart.  This includes consultation notes, operative notes, office notes, lab results and pathology reports.  If you have any questions about what you read please let us know at your next visit or call us at the office.  We are right here  with you.  This document serves as a record of services personally performed by Anna Lot, DO. It was created on her behalf by Peggye Fothergill, a trained medical scribe. The creation of this record is based on the scribe's personal observations and the provider's statements to them.   This case required medical decision making of at least moderate complexity. The above documentation from Peggye Fothergill, medical scribe, has been reviewed by Carlye Grippe, D.O.   __________________________________________________________________________________   Patient Care Team    Relationship Specialty Notifications Start End  Julaine Fusi, NP PCP - General Family Medicine  03/14/17     -Vitals obtained; medications/ allergies reconciled;  personal medical, social, Sx etc.histories were updated by CMA, reviewed by me and are reflected in chart   Patient Active Problem List   Diagnosis Date Noted  . Genital warts 12/23/2019  . Stress 12/23/2019  . Anxiety 11/24/2019  . Health care maintenance 03/14/2017  . Shingles outbreak 03/14/2017  . 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 Seasonal allergic rhinitis 12/03/2015  . Conductive hearing loss 12/03/2015  . Dysfunction of both eustachian tubes 12/03/2015     Current Meds  Medication Sig  . JUNEL FE 1/20 1-20 MG-MCG tablet Take 1 tablet by mouth daily.  . ondansetron (ZOFRAN-ODT) 4 MG disintegrating tablet Take 1 tablet (4 mg total) by mouth every 8 (eight) hours as needed for nausea or vomiting.  . [DISCONTINUED] FLUoxetine (PROZAC) 10 MG tablet TABLET EVERY DAY AS DIRECTED**PATIENT NEEDS APT FOR FURTHER REFILLS**      Allergies:  No Known Allergies   ROS:  See above HPI for pertinent positives and negatives   Objective:   Height 5\' 4"  (1.626 m), weight 139 lb 3.2 oz (63.1 kg), last menstrual period 12/23/2019.  (if some vitals are omitted, this means that patient was UNABLE to obtain them even though they were asked to get them prior to OV today.  They were asked to call 02/22/2020 at their earliest convenience with these once obtained. ) General: A & O * 3; sounds in no acute distress; in usual state of health.  Skin: Pt confirms warm and dry extremities and pink fingertips HEENT: Pt confirms lips non-cyanotic Chest: Patient confirms normal chest excursion and movement Respiratory: speaking in full sentences, no conversational dyspnea; patient confirms no use of accessory muscles Psych: insight appears good, mood- appears full

## 2020-03-19 ENCOUNTER — Other Ambulatory Visit: Payer: Self-pay | Admitting: Family Medicine

## 2020-03-20 ENCOUNTER — Other Ambulatory Visit: Payer: Self-pay | Admitting: Family Medicine

## 2020-03-22 MED ORDER — FLUOXETINE HCL 20 MG PO TABS: 20.0000 mg | ORAL_TABLET | Freq: Every day | ORAL | 0 refills | Status: DC

## 2020-05-20 ENCOUNTER — Other Ambulatory Visit: Payer: Self-pay | Admitting: Physician Assistant

## 2020-06-29 ENCOUNTER — Other Ambulatory Visit: Payer: Self-pay | Admitting: Physician Assistant

## 2020-06-29 ENCOUNTER — Telehealth: Payer: Self-pay | Admitting: Physician Assistant

## 2020-06-29 NOTE — Telephone Encounter (Signed)
Please call patient to schedule apt for further med refills.  ° °AS,CMA °

## 2020-07-30 DIAGNOSIS — Z3689 Encounter for other specified antenatal screening: Secondary | ICD-10-CM | POA: Diagnosis not present

## 2020-07-30 DIAGNOSIS — Z3201 Encounter for pregnancy test, result positive: Secondary | ICD-10-CM | POA: Diagnosis not present

## 2020-08-24 DIAGNOSIS — Z3201 Encounter for pregnancy test, result positive: Secondary | ICD-10-CM | POA: Diagnosis not present

## 2020-08-31 ENCOUNTER — Telehealth: Payer: Self-pay | Admitting: Physician Assistant

## 2020-08-31 NOTE — Telephone Encounter (Signed)
Advised patient to call OBGYN for treatment of shingles. AS, CMA

## 2020-08-31 NOTE — Telephone Encounter (Signed)
Left message for patient to call back. AS, CMA 

## 2020-08-31 NOTE — Telephone Encounter (Signed)
Pt has not been seen since 12/2019 and has not seen you yet.  Please advise.  Tiajuana Amass, CMA

## 2020-08-31 NOTE — Telephone Encounter (Signed)
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 Anna [name] use to give her meds when needed. Patient is pregnant and shingles is flaring up under and around the eye. Can you please call and advise. Thank you

## 2020-09-08 DIAGNOSIS — Z3689 Encounter for other specified antenatal screening: Secondary | ICD-10-CM | POA: Diagnosis not present

## 2020-09-08 DIAGNOSIS — Z3401 Encounter for supervision of normal first pregnancy, first trimester: Secondary | ICD-10-CM | POA: Diagnosis not present

## 2020-09-08 DIAGNOSIS — Z01419 Encounter for gynecological examination (general) (routine) without abnormal findings: Secondary | ICD-10-CM | POA: Diagnosis not present

## 2020-09-08 LAB — OB RESULTS CONSOLE HEPATITIS B SURFACE ANTIGEN: Hepatitis B Surface Ag: NEGATIVE

## 2020-09-08 LAB — OB RESULTS CONSOLE RUBELLA ANTIBODY, IGM: Rubella: IMMUNE

## 2020-09-08 LAB — OB RESULTS CONSOLE GC/CHLAMYDIA
Chlamydia: POSITIVE
Gonorrhea: NEGATIVE

## 2020-09-08 LAB — OB RESULTS CONSOLE RPR: RPR: NONREACTIVE

## 2020-09-08 LAB — OB RESULTS CONSOLE HIV ANTIBODY (ROUTINE TESTING): HIV: NONREACTIVE

## 2020-10-11 DIAGNOSIS — Z3402 Encounter for supervision of normal first pregnancy, second trimester: Secondary | ICD-10-CM | POA: Diagnosis not present

## 2020-10-16 NOTE — L&D Delivery Note (Addendum)
Delivery Note:   Anna [name] at [redacted]w[redacted]d  Admitting diagnosis: PROM (premature rupture of membranes) [O42.90] Risks: Chlamydia (+) at NOB, treated, negative TOC GBS pos 3. Excessive TWG, 60 lbs  First Stage:  Induction of labor:N/a Onset of labor: 03/27/21 @ 0400 Augmentation: Pitocin ROM: SROM 03/27/2021 @ 0400 clear fluid noted  Active labor onset: 03/27/21 @ 1630  Analgesia /Anesthesia/Pain control intrapartum: Epidural   Second Stage:  Complete dilation at 03/27/2021  2115 Onset of pushing at 2125 FHR second stage Category II. Moderate variability, accels present with variables while pushing. Delivery Imminent    Pushing in Lithotomy position with SNM, CNM and L&D staff support at bedside.  FOB and maternal mother present for delivery and supportive.   Nuchal Cord: Yes  Delivery of a Live born female  Birth Weight: 7 lb 9.7 oz (3450 [name]) APGAR: 8/9,   Newborn Delivery   Birth date/time: 03/27/2021 21:47:00 Delivery type: Vaginal, Spontaneous      Infant delivered cephalic with a posterior compound hand presentation, in ROA position and restituted to ROT position.Nuchal cord x1 reduced. Posterior arm released in a overhead sweeping motion. Anterior shoulder and remaining fetal body delivered with ease. Infant placed immediately S2S with mom. Infant dried and stimulated and began to cry vigorously at 1 minute of life.   Cord double clamped after cessation of pulsation by SNM, cut by FOB.  Collection of cord blood for typing completed. Cord blood donation-None  Arterial cord blood sample-No    Third Stage:  Placenta separated partially via manual extraction, but soon released and delivered spontaneously intact with 3 vessels. Trailing Membranes noted.   Uterine atony with moderate bleeding. MD called to bedside to evaluate. Manual sweep performed for retained parts. Membrane fragments removed. Bleeding began to resolve quickly with IV Pitocin, IM methergine and IV  TXA  Placenta to L&D for disposal .    1st degree laceration, and partial right labial separation identified. MD identified Partial cervical avulsion-Stable   Episiotomy:None  Local analgesia: 1% Lidocaine   Repair: Right labia approximated well with 4.0 Vicryl SH. 1st degree repaired in traditional fashion with 3.0 Vicryl and good hemostasis.   Est. Blood Loss (mL):683.00   Complications: None   Mom to postpartum.  Baby boy "Anna [name]" to Couplet care / Skin to Skin.  Delivery Report:  Review the Delivery Report for details.     Signed: Shantonette Danella Deis) Suzie Portela, BSN, RNC-OB  Student Nurse-Midwife   03/27/2021  11:28 PM  Medical screening examination/treatment/procedure(s) were conducted as a shared visit with non-physician practitioner(s) and myself.  I personally evaluated the patient during the encounter.   Neta Mends, CNM, MSN 03/27/2021, 11:45 PM        I was called after delivery and during repair for [facility] and evaluation of possible cervical laceration. En route I advised CNM to order uterotonics, methergine and put ring forceps on edges of cervical laceration. When I arrived labial laceration was being repaired. With proper retraction of vaginal walls I was able to identify small cervical avulsion, <20% thickness, 1 x 3 cm area at 10o'clock which was hemostatic by the time I arrived. Pt was hemodynamically stable with no si/sx anemia or hypotension. Cervix was larger than usual and edematous but hemostatic. Uterus was firm, bladder was not overdistended and manual exploration of uterus revealed no further membranes and no clots. Pt wsa given IV abx, methergine and TXA. Repair completed by CNM and bleeding remained appropriate. PPHaround 650 due to partial  cervical laceration and uterine atony, most likely complicated by subacute chorioamnionitis due to PROM and as evidenced by elevated WBC PP.   Alphonsus Sias Fogleman 03/28/2021 10:52 AM

## 2020-10-18 DIAGNOSIS — Z361 Encounter for antenatal screening for raised alphafetoprotein level: Secondary | ICD-10-CM | POA: Diagnosis not present

## 2020-11-11 DIAGNOSIS — Z363 Encounter for antenatal screening for malformations: Secondary | ICD-10-CM | POA: Diagnosis not present

## 2020-11-12 ENCOUNTER — Inpatient Hospital Stay (HOSPITAL_COMMUNITY): Admission: AD | Admit: 2020-11-12 | Payer: Self-pay | Source: Home / Self Care | Admitting: Obstetrics & Gynecology

## 2020-11-29 DIAGNOSIS — Z362 Encounter for other antenatal screening follow-up: Secondary | ICD-10-CM | POA: Diagnosis not present

## 2021-01-11 DIAGNOSIS — Z3689 Encounter for other specified antenatal screening: Secondary | ICD-10-CM | POA: Diagnosis not present

## 2021-01-11 DIAGNOSIS — Z23 Encounter for immunization: Secondary | ICD-10-CM | POA: Diagnosis not present

## 2021-01-27 DIAGNOSIS — O26843 Uterine size-date discrepancy, third trimester: Secondary | ICD-10-CM | POA: Diagnosis not present

## 2021-01-27 DIAGNOSIS — Z3A30 30 weeks gestation of pregnancy: Secondary | ICD-10-CM | POA: Diagnosis not present

## 2021-03-08 DIAGNOSIS — O26843 Uterine size-date discrepancy, third trimester: Secondary | ICD-10-CM | POA: Diagnosis not present

## 2021-03-08 DIAGNOSIS — Z3685 Encounter for antenatal screening for Streptococcus B: Secondary | ICD-10-CM | POA: Diagnosis not present

## 2021-03-08 DIAGNOSIS — Z3A36 36 weeks gestation of pregnancy: Secondary | ICD-10-CM | POA: Diagnosis not present

## 2021-03-08 LAB — OB RESULTS CONSOLE GBS: GBS: POSITIVE

## 2021-03-27 ENCOUNTER — Encounter (HOSPITAL_COMMUNITY): Payer: Self-pay | Admitting: Obstetrics and Gynecology

## 2021-03-27 ENCOUNTER — Inpatient Hospital Stay (HOSPITAL_COMMUNITY): Payer: BC Managed Care – PPO | Admitting: Anesthesiology

## 2021-03-27 ENCOUNTER — Inpatient Hospital Stay (HOSPITAL_COMMUNITY)
Admission: AD | Admit: 2021-03-27 | Discharge: 2021-03-29 | DRG: 805 | Disposition: A | Discharge status: Home / Self Care | Payer: BC Managed Care – PPO | Attending: Obstetrics and Gynecology | Admitting: Obstetrics and Gynecology

## 2021-03-27 ENCOUNTER — Other Ambulatory Visit: Payer: Self-pay

## 2021-03-27 DIAGNOSIS — O9081 Anemia of the puerperium: Secondary | ICD-10-CM | POA: Diagnosis not present

## 2021-03-27 DIAGNOSIS — D649 Anemia, unspecified: Secondary | ICD-10-CM | POA: Diagnosis not present

## 2021-03-27 DIAGNOSIS — Z20822 Contact with and (suspected) exposure to covid-19: Secondary | ICD-10-CM | POA: Diagnosis present

## 2021-03-27 DIAGNOSIS — F419 Anxiety disorder, unspecified: Secondary | ICD-10-CM | POA: Diagnosis present

## 2021-03-27 DIAGNOSIS — Z3A38 38 weeks gestation of pregnancy: Secondary | ICD-10-CM | POA: Diagnosis not present

## 2021-03-27 DIAGNOSIS — Z412 Encounter for routine and ritual male circumcision: Secondary | ICD-10-CM | POA: Diagnosis not present

## 2021-03-27 DIAGNOSIS — Z8249 Family history of ischemic heart disease and other diseases of the circulatory system: Secondary | ICD-10-CM | POA: Diagnosis not present

## 2021-03-27 DIAGNOSIS — D62 Acute posthemorrhagic anemia: Secondary | ICD-10-CM | POA: Diagnosis not present

## 2021-03-27 DIAGNOSIS — J45909 Unspecified asthma, uncomplicated: Secondary | ICD-10-CM | POA: Diagnosis not present

## 2021-03-27 DIAGNOSIS — O4202 Full-term premature rupture of membranes, onset of labor within 24 hours of rupture: Secondary | ICD-10-CM

## 2021-03-27 DIAGNOSIS — O99824 Streptococcus B carrier state complicating childbirth: Secondary | ICD-10-CM | POA: Diagnosis not present

## 2021-03-27 DIAGNOSIS — O4292 Full-term premature rupture of membranes, unspecified as to length of time between rupture and onset of labor: Principal | ICD-10-CM | POA: Diagnosis present

## 2021-03-27 DIAGNOSIS — O411230 Chorioamnionitis, third trimester, not applicable or unspecified: Secondary | ICD-10-CM | POA: Diagnosis present

## 2021-03-27 DIAGNOSIS — O322XX0 Maternal care for transverse and oblique lie, not applicable or unspecified: Secondary | ICD-10-CM | POA: Diagnosis present

## 2021-03-27 DIAGNOSIS — O4290 Premature rupture of membranes, unspecified as to length of time between rupture and onset of labor, unspecified weeks of gestation: Secondary | ICD-10-CM | POA: Diagnosis present

## 2021-03-27 DIAGNOSIS — O9952 Diseases of the respiratory system complicating childbirth: Secondary | ICD-10-CM | POA: Diagnosis not present

## 2021-03-27 DIAGNOSIS — Z23 Encounter for immunization: Secondary | ICD-10-CM | POA: Diagnosis not present

## 2021-03-27 HISTORY — DX: Unspecified asthma, uncomplicated: J45.909

## 2021-03-27 LAB — RESP PANEL BY RT-PCR (FLU A&B, COVID) ARPGX2
Influenza A by PCR: NEGATIVE
Influenza B by PCR: NEGATIVE
SARS Coronavirus 2 by RT PCR: NEGATIVE

## 2021-03-27 LAB — CBC
HCT: 30.8 % — ABNORMAL LOW (ref 36.0–46.0)
Hemoglobin: 10.4 g/dL — ABNORMAL LOW (ref 12.0–15.0)
MCH: 32.9 pg (ref 26.0–34.0)
MCHC: 33.8 g/dL (ref 30.0–36.0)
MCV: 97.5 fL (ref 80.0–100.0)
Platelets: 185 10*3/uL (ref 150–400)
RBC: 3.16 MIL/uL — ABNORMAL LOW (ref 3.87–5.11)
RDW: 13.2 % (ref 11.5–15.5)
WBC: 10.9 10*3/uL — ABNORMAL HIGH (ref 4.0–10.5)
nRBC: 0 % (ref 0.0–0.2)

## 2021-03-27 LAB — TYPE AND SCREEN
ABO/RH(D): A POS
Antibody Screen: NEGATIVE

## 2021-03-27 LAB — RPR: RPR Ser Ql: NONREACTIVE

## 2021-03-27 LAB — POCT FERN TEST: POCT Fern Test: POSITIVE

## 2021-03-27 MED ORDER — CEFAZOLIN SODIUM-DEXTROSE 2-4 GM/100ML-% IV SOLN
2.0000 g | Freq: Once | INTRAVENOUS | Status: AC
  Administered 2021-03-27: 2 g via INTRAVENOUS
  Filled 2021-03-27: qty 100

## 2021-03-27 MED ORDER — LIDOCAINE HCL (PF) 1 % IJ SOLN
INTRAMUSCULAR | Status: DC | PRN
  Administered 2021-03-27 (×2): 5 mL via EPIDURAL

## 2021-03-27 MED ORDER — SOD CITRATE-CITRIC ACID 500-334 MG/5ML PO SOLN: 30.0000 mL | ORAL | Status: DC | PRN

## 2021-03-27 MED ORDER — OXYTOCIN-SODIUM CHLORIDE 30-0.9 UT/500ML-% IV SOLN
2.5000 [IU]/h | INTRAVENOUS | Status: DC
  Administered 2021-03-27: 2.5 [IU]/h via INTRAVENOUS
  Filled 2021-03-27: qty 500

## 2021-03-27 MED ORDER — ACETAMINOPHEN 325 MG PO TABS: 650.0000 mg | ORAL_TABLET | ORAL | Status: DC | PRN

## 2021-03-27 MED ORDER — SODIUM CHLORIDE 0.9% FLUSH: 3.0000 mL | Freq: Two times a day (BID) | INTRAVENOUS | Status: DC

## 2021-03-27 MED ORDER — LACTATED RINGERS IV SOLN
500.0000 mL | Freq: Once | INTRAVENOUS | Status: AC
  Administered 2021-03-27: 500 mL via INTRAVENOUS

## 2021-03-27 MED ORDER — PHENYLEPHRINE 40 MCG/ML (10ML) SYRINGE FOR IV PUSH (FOR BLOOD PRESSURE SUPPORT): 80.0000 ug | PREFILLED_SYRINGE | INTRAVENOUS | Status: DC | PRN

## 2021-03-27 MED ORDER — PHENYLEPHRINE 40 MCG/ML (10ML) SYRINGE FOR IV PUSH (FOR BLOOD PRESSURE SUPPORT)
80.0000 ug | PREFILLED_SYRINGE | INTRAVENOUS | Status: DC | PRN
  Filled 2021-03-27: qty 10

## 2021-03-27 MED ORDER — LACTATED RINGERS IV SOLN: INTRAVENOUS | Status: DC

## 2021-03-27 MED ORDER — SODIUM CHLORIDE 0.9% FLUSH: 3.0000 mL | INTRAVENOUS | Status: DC | PRN

## 2021-03-27 MED ORDER — LACTATED RINGERS IV SOLN: 500.0000 mL | INTRAVENOUS | Status: DC | PRN

## 2021-03-27 MED ORDER — PENICILLIN G POT IN DEXTROSE 60000 UNIT/ML IV SOLN
3.0000 10*6.[IU] | INTRAVENOUS | Status: DC
  Administered 2021-03-27 (×3): 3 10*6.[IU] via INTRAVENOUS
  Filled 2021-03-27 (×4): qty 50

## 2021-03-27 MED ORDER — SODIUM CHLORIDE 0.9 % IV SOLN: 250.0000 mL | INTRAVENOUS | Status: DC | PRN

## 2021-03-27 MED ORDER — LIDOCAINE HCL (PF) 1 % IJ SOLN
30.0000 mL | INTRAMUSCULAR | Status: AC | PRN
  Administered 2021-03-27: 30 mL via SUBCUTANEOUS
  Filled 2021-03-27: qty 30

## 2021-03-27 MED ORDER — OXYTOCIN BOLUS FROM INFUSION
333.0000 mL | Freq: Once | INTRAVENOUS | Status: AC
  Administered 2021-03-27: 333 mL via INTRAVENOUS

## 2021-03-27 MED ORDER — TERBUTALINE SULFATE 1 MG/ML IJ SOLN: 0.2500 mg | Freq: Once | INTRAMUSCULAR | Status: DC | PRN

## 2021-03-27 MED ORDER — OXYTOCIN-SODIUM CHLORIDE 30-0.9 UT/500ML-% IV SOLN
1.0000 m[IU]/min | INTRAVENOUS | Status: DC
  Administered 2021-03-27: 2 m[IU]/min via INTRAVENOUS

## 2021-03-27 MED ORDER — ONDANSETRON HCL 4 MG/2ML IJ SOLN
4.0000 mg | Freq: Four times a day (QID) | INTRAMUSCULAR | Status: DC | PRN
  Administered 2021-03-27: 4 mg via INTRAVENOUS
  Filled 2021-03-27: qty 2

## 2021-03-27 MED ORDER — METHYLERGONOVINE MALEATE 0.2 MG/ML IJ SOLN
INTRAMUSCULAR | Status: AC
  Administered 2021-03-27: 0.2 mg
  Filled 2021-03-27: qty 1

## 2021-03-27 MED ORDER — EPHEDRINE 5 MG/ML INJ: 10.0000 mg | INTRAVENOUS | Status: DC | PRN

## 2021-03-27 MED ORDER — TRANEXAMIC ACID-NACL 1000-0.7 MG/100ML-% IV SOLN
INTRAVENOUS | Status: AC
  Administered 2021-03-27: 1000 mg via INTRAVENOUS
  Filled 2021-03-27: qty 100

## 2021-03-27 MED ORDER — FLUOXETINE HCL 20 MG PO CAPS
20.0000 mg | ORAL_CAPSULE | Freq: Every day | ORAL | Status: DC
  Filled 2021-03-27: qty 1

## 2021-03-27 MED ORDER — DIPHENHYDRAMINE HCL 50 MG/ML IJ SOLN
12.5000 mg | INTRAMUSCULAR | Status: DC | PRN
  Administered 2021-03-27: 12.5 mg via INTRAVENOUS
  Filled 2021-03-27: qty 1

## 2021-03-27 MED ORDER — FENTANYL-BUPIVACAINE-NACL 0.5-0.125-0.9 MG/250ML-% EP SOLN
12.0000 mL/h | EPIDURAL | Status: DC | PRN
  Administered 2021-03-27: 12 mL/h via EPIDURAL
  Filled 2021-03-27: qty 250

## 2021-03-27 MED ORDER — TRANEXAMIC ACID-NACL 1000-0.7 MG/100ML-% IV SOLN: 1000.0000 mg | INTRAVENOUS | Status: AC

## 2021-03-27 MED ORDER — OXYTOCIN 10 UNIT/ML IJ SOLN: 10.0000 [IU] | Freq: Once | INTRAMUSCULAR | Status: DC

## 2021-03-27 MED ORDER — SODIUM CHLORIDE 0.9 % IV SOLN
5.0000 10*6.[IU] | Freq: Once | INTRAVENOUS | Status: AC
  Administered 2021-03-27: 5 10*6.[IU] via INTRAVENOUS
  Filled 2021-03-27: qty 5

## 2021-03-27 NOTE — Anesthesia Preprocedure Evaluation (Addendum)
Anesthesia Evaluation  Patient identified by MRN, date of birth, ID band Patient awake    Reviewed: Allergy & Precautions, NPO status , Patient's Chart, lab work & pertinent test results  Airway Mallampati: II  TM Distance: >3 FB Neck ROM: Full    Dental  (+) Teeth Intact   Pulmonary asthma ,    Pulmonary exam normal        Cardiovascular negative cardio ROS   Rhythm:Regular Rate:Normal     Neuro/Psych Anxiety negative neurological ROS     GI/Hepatic negative GI ROS, Neg liver ROS,   Endo/Other  negative endocrine ROS  Renal/GU negative Renal ROS  negative genitourinary   Musculoskeletal negative musculoskeletal ROS (+)   Abdominal (+)  Abdomen: soft. Bowel sounds: normal.  Peds  Hematology negative hematology ROS (+)   Anesthesia Other Findings   Reproductive/Obstetrics (+) Pregnancy                             Anesthesia Physical Anesthesia Plan  ASA: 2  Anesthesia Plan: Epidural   Post-op Pain Management:    Induction:   PONV Risk Score and Plan: 2 and Treatment may vary due to age or medical condition  Airway Management Planned: Natural Airway  Additional Equipment: None  Intra-op Plan:   Post-operative Plan:   Informed Consent: I have reviewed the patients History and Physical, chart, labs and discussed the procedure including the risks, benefits and alternatives for the proposed anesthesia with the patient or authorized representative who has indicated his/her understanding and acceptance.     Dental advisory given  Plan Discussed with:   Anesthesia Plan Comments: (Lab Results      Component                Value               Date                      WBC                      10.9 (H)            03/27/2021                HGB                      10.4 (L)            03/27/2021                HCT                      30.8 (L)            03/27/2021                 MCV                      97.5                03/27/2021                PLT                      185                 03/27/2021          )  Anesthesia Quick Evaluation  

## 2021-03-27 NOTE — H&P (Signed)
OB ADMISSION/ HISTORY & PHYSICAL:  Admission Date: 03/27/2021  5:33 AM  Admit Diagnosis: PROM (premature rupture of membranes) [O42.90]  Anna [name] is a 30 y.o. female presenting for PROM at 0400, confirmed in MAU. Does not feel any contractions, good FM, no VB. Planning epidural for active labor.  Partner Prel and mother Selena Batten present and supportive. Expecting baby boy Heloise Purpura, plans to breastfeed and circumcision.  Prenatal History: G1P0   EDC : 04/05/2021,  Prenatal care at [facility] since 10 wks gest.   Prenatal course complicated by: Chlamydia (+) at NOB, treated, negative TOC  GBS pos 3. Excessive TWG, 60 lbs  Prenatal Labs: ABO, Rh:   A pos Antibody: NEG (06/12 0643) Rubella:   imm RPR:   neg HBsAg:   neg HIV:   neg GBS:   pos 1 hr Glucola : 121 Genetic Screening: NIPS LR XY Ultrasound: normal XY anatomy, posterior placenta, AGA Growth 38 wks EFW 69% 6'10", AC 86%, AFI 18  Vaccines: TDaP          UTD         Flu             declined                    COVID-19 UTD    Maternal Diabetes: No Genetic Screening: Normal Maternal Ultrasounds/Referrals: Normal Fetal Ultrasounds or other Referrals:  None Maternal Substance Abuse:  No Significant Maternal Medications:  Meds include: Prozac Significant Maternal Lab Results:  Group B Strep positive Other Comments:  None  Medical /  Surgical History :  Past medical history:  Past Medical History:  Diagnosis Date   Asthma      Past surgical history:  Past Surgical History:  Procedure Laterality Date   HAND SURGERY  2009   TYMPANOSTOMY TUBE PLACEMENT     6 sets     Family History: History reviewed. No pertinent family history.   Social History:  reports that she has never smoked. She has never used smokeless tobacco. She reports previous alcohol use. She reports that she does not use drugs.  Allergies: Patient has no known allergies.   Current Medications at time of admission:  Medications Prior to Admission  Medication Sig Dispense Refill Last Dose   ondansetron (ZOFRAN-ODT) 4 MG disintegrating tablet Take 1 tablet (4 mg total) by mouth every 8 (eight) hours as needed for nausea or vomiting. 20 tablet 0 Past Month   FLUoxetine (PROZAC) 20 MG tablet Take 1 tablet (20 mg total) by mouth daily. **NEEDS APT FOR FURTHER REFILLS** 30 tablet 0 More than a month     Review of Systems: ROS As noted above  Physical Exam: Vital signs and nursing notes reviewed.  Patient Vitals for the past 24 hrs:  BP Temp Temp src Pulse Resp Height Weight  03/27/21 0746 -- 98 F (36.7 C) Oral -- -- -- --  03/27/21 0730 130/76 98 F (36.7 C) Oral 73 18 -- --  03/27/21 0547 118/73 -- -- 78 -- -- --  03/27/21 0546 -- 97.8 F (36.6 C) -- -- 18 5\' 4"  (1.626 m) 98.4 kg     General: AAO x 3, NAD, copingwell Heart: RRR Lungs:CTAB Abdomen: Gravid, NT, Leopold's vertex, fetal spine to maternal R Extremities: no edema Genitalia / VE: Dilation: 3 Effacement (%): 80 Station: -1 Presentation: Vertex Exam by:: D Maisie Hauser CNM   FHR: 135 BPM, moderate variability, + accels, no decels TOCO: Ctx none  Labs:   Pending T&S, CBC, RPR  Recent Labs    03/27/21 0643  WBC 10.9*  HGB  10.4*  HCT 30.8*  PLT 185     Assessment:  30 y.o. G1P0 at [redacted]w[redacted]d  1. Latent stage of labor, PROM 2. FHR category 1 3. GBS pos 4. Desires  epidural 5. Breastfeeding 6. Placenta disposal per request  Plan: 1. Admit to BS 2. Routine L&D orders, GBS prophylaxis, PCN 3. Analgesia/anesthesia PRN  4. Pitocin augmentation 5. Anticipate NSVB   Dr Billy Coast notified of admission / plan of care   Neta Mends CNM, MSN 03/27/2021, 9:35 AM

## 2021-03-27 NOTE — MAU Provider Note (Signed)
S: [name] is a 30 y.o. G1P0 at [redacted]w[redacted]d  who presents to MAU today complaining of leaking of fluid since 0400. She denies vaginal bleeding. She denies contractions. She reports normal fetal movement.    O: BP 118/73   Pulse 78   Temp 97.8 F (36.6 C)   Resp 18   Ht 5\' 4"  (1.626 m)   Wt 98.4 kg   BMI 37.25 kg/m  GENERAL: Well-developed, well-nourished female in no acute distress.  HEAD: Normocephalic, atraumatic.  CHEST: Normal effort of breathing, regular heart rate ABDOMEN: Soft, nontender, gravid PELVIC: Normal external female genitalia. Prior to insertion of speculum, patient coughs and fluid expells from vagina.  Fern collected.   Cervical exam:  Presentation: Vertex (vtx confirmed by bedside u/s by CNM) Exam by:: [email or web address] CNM   Fetal Monitoring: FHT: 150 bpm, Mod Var, -Decels, +Accels Toco: Uterine iritability  Results for orders placed or performed during the hospital encounter of 03/27/21 (from the past 24 hour(s))  POCT fern test     Status: Normal   Collection Time: 03/27/21  6:35 AM  Result Value Ref Range   POCT Fern Test Positive = ruptured amniotic membanes      A: SIUP at [redacted]w[redacted]d  SROM  P: Patient informed that she is grossly ruptured and fern confirms diagnosis. BSUS confirms vertex presentation.  Report given to RN to contact MD on call for further instructions  [redacted]w[redacted]d, [facility] 03/27/2021 6:46 AM

## 2021-03-27 NOTE — Anesthesia Procedure Notes (Signed)
Epidural Patient location during procedure: OB Start time: 03/27/2021 3:20 PM End time: 03/27/2021 3:29 PM  Staffing Anesthesiologist: Atilano Median, DO Performed: anesthesiologist   Preanesthetic Checklist Completed: patient identified, IV checked, site marked, risks and benefits discussed, surgical consent, monitors and equipment checked, pre-op evaluation and timeout performed  Epidural Patient position: sitting Prep: ChloraPrep Patient monitoring: heart rate, continuous pulse ox and blood pressure Approach: midline Location: L3-L4 Injection technique: LOR saline  Needle:  Needle type: Tuohy  Needle gauge: 17 G Needle length: 9 cm Needle insertion depth: 6 cm Catheter type: closed end flexible Catheter size: 20 Guage Catheter at skin depth: 11 cm Test dose: negative and 1.5% lidocaine  Assessment Events: blood not aspirated, injection not painful, no injection resistance and no paresthesia  Additional Notes Patient identified. Risks/Benefits/Options discussed with patient including but not limited to bleeding, infection, nerve damage, paralysis, failed block, incomplete pain control, headache, blood pressure changes, nausea, vomiting, reactions to medications, itching and postpartum back pain. Confirmed with bedside nurse the patient's most recent platelet count. Confirmed with patient that they are not currently taking any anticoagulation, have any bleeding history or any family history of bleeding disorders. Patient expressed understanding and wished to proceed. All questions were answered. Sterile technique was used throughout the entire procedure. Please see nursing notes for vital signs. Test dose was given through epidural catheter and negative prior to continuing to dose epidural or start infusion. Warning signs of high block given to the patient including shortness of breath, tingling/numbness in hands, complete motor block, or any concerning symptoms with  instructions to call for help. Patient was given instructions on fall risk and not to get out of bed. All questions and concerns addressed with instructions to call with any issues or inadequate analgesia.    Reason for block:procedure for pain

## 2021-03-27 NOTE — Progress Notes (Signed)
Ivonne Andrew CNM notified of pt's admission and status. Aware of srom, clear fld, vtx by bedside u/s, Cat 1 strip, GBS +. Will admit to [facility]

## 2021-03-27 NOTE — Lactation Note (Signed)
This note was copied from a baby's chart. Lactation Consultation Note  Patient Name: Anna [name] [id number] Date: 03/27/2021 Reason for consult: L&D Initial assessment Age:32 hours P1, ETI female infant. LC entered the room, infant was cuing to BF as mom was doing STS. Infant was not interested in latching at the breast at first. Mom hand expressed 8 mls of colostrum that was spoon feed, afterwards infant was cuing to breastfeed. Due to mom having short shafted breast, LC ask mom to do breast stimulation which help evert mom's nipple shaft out more. Mom latched infant on her left breast using the football hold position , infant was on and off breast for 20 minutes, started sustaining latch better towards the end of the feeding. Mom knows to BF infant according primal cues: licking , smacking, tasting, rooting and hands in mouth,STS. Mom knows to ask RN or LC on MBU if she needs further assistance with latching infant at the breast. LC discussed infant's input and output with parents.  Due to mom having short shafted nipples she would benefit from hand pump to pre-pump breast prior to latching infant at the breast, LC will inform RN on MBU.  Maternal Data Has patient been taught Hand Expression?: Yes Does the patient have breastfeeding experience prior to this delivery?: No  Feeding Mother's Current Feeding Choice: Breast Milk  LATCH Score Latch: Repeated attempts needed to sustain latch, nipple held in mouth throughout feeding, stimulation needed to elicit sucking reflex.  Audible Swallowing: A few with stimulation  Type of Nipple: Everted at rest and after stimulation  Comfort (Breast/Nipple): Soft / non-tender  Hold (Positioning): Assistance needed to correctly position infant at breast and maintain latch.  LATCH Score: 7   Lactation Tools Discussed/Used    Interventions Interventions: Breast feeding basics reviewed;Assisted with latch;Skin to skin;Hand express;Breast  compression;Adjust position;Support pillows;Position options;Expressed milk;Education  Discharge Pump: Personal WIC Program: No  Consult Status Consult Status: Follow-up Date: 03/28/21 Follow-up type: In-patient    [name] 03/27/2021, 11:49 PM

## 2021-03-27 NOTE — Progress Notes (Signed)
Pt up to BR

## 2021-03-27 NOTE — Progress Notes (Signed)
Progress Note                                                                                                                                                                                                                                                                                                                                                                                                                                                                                                                                                                              Anna [name] is a 30 y.o. G1P0 at [redacted]w[redacted]d admitted for  Spontaneous onset of labor with SROM at 0400am.   Subjective: Claretha is doing well. She is beginning to feel intermittent rectal pressure and states that contractions are  becoming more intense. She desires to start 50/50 Nitrous oxide gas for pain management.  Objective: Vitals:   03/27/21 1030 03/27/21 1108 03/27/21 1208 03/27/21 1322  BP: 119/67 124/74 114/61 102/64  Pulse: 92 86 81 71  Resp: 18 18 18 18   Temp:   98.2 F (36.8 C)   TempSrc:   Oral   Weight:      Height:       FHT:  FHR: 135 bpm, variability: moderate,  accelerations:  Present,  decelerations:  Absent UC:   regular, every 2-3 minutes SVE:   Dilation: 3 Effacement (%): 80 Station: -1 Exam by:: D Paul CNM  Vaginal Exam deferred d/t SROM and just beginning to get in a active contraction pattern.  Labs:   Recent Labs    03/27/21 0643  WBC 10.9*  HGB 10.4*  HCT 30.8*  PLT 185    Assessment / Plan: G1P0 30 y.o. [redacted]w[redacted]d Spontaneous onset of labor. Progressing well.   Labor: Progressing on Pitocin - will continue to increase Pitocin 2x2 PRN and assess for Fore-bag.   Fetal Wellbeing:  Category I  -Monitor for sx of distress.  Pain Control:  Nitrous Oxide I/D:   GBS Positive; Prophylaxis PCN x  2 Anticipated MOD:  NSVD  Treasure Ochs 09-06-1988) Danella Deis, BSN, RNC-OB  Student Nurse-Midwife   03/27/2021   2:26 PM

## 2021-03-27 NOTE — Progress Notes (Signed)
S: Doing well, pain relieved by Epidural. Anna [name] denies any pelvic pain or pressure. She desires to take a nap now that she is comfortable with epidural.    O: Vitals:   03/27/21 1600 03/27/21 1606 03/27/21 1640 03/27/21 1719  BP: 117/64 119/70 127/74 117/68  Pulse: 86 85 84 82  Resp: 16 18 18    Temp: 98 F (36.7 C)     TempSrc: Oral     Weight:      Height:         FHT:  FHR: 125 bpm, variability: moderate,  accelerations:  Present,  decelerations:  Absent UC:   regular, every 2-3 minutes, moderate to palpation  SVE:   Dilation: 5.5 Effacement (%): 80 Station: 0 Exam by:: Shay SNM   A / P: Augmentation of labor, progressing well on 16 miliunits of pitocin. S/p SROM, and initiation of Pit.  -Continue to increase pitocin 2x2 PRN with frequent position changes.   Fetal Wellbeing:  Category I  -Monitoring for signs of distress Pain Control:  Epidural  Anticipated MOD:  NSVD  Jansel Vonstein [email or web address]) Danella Deis, BSN, RNC-OB  Student Nurse-Midwife   03/27/2021  5:47 PM

## 2021-03-27 NOTE — MAU Note (Addendum)
My water broke about 0400. I do not think I am contracting. Has not had a cervical exam. GBS + per pt

## 2021-03-27 NOTE — Progress Notes (Addendum)
S: Doing well, pain well managed by Epidural. She is feeling occasional vaginal pressure.  O: Vitals:   03/27/21 1640 03/27/21 1719 03/27/21 1801 03/27/21 1831  BP: 127/74 117/68 (!) 95/57 115/69  Pulse: 84 82 81 75  Resp: 18  18 18   Temp:   98.1 F (36.7 C)   TempSrc:   Oral   Weight:      Height:         FHT:  FHR: 125 bpm, variability: moderate,  accelerations:  Present,  decelerations:  Absent UC:   regular, every 2-3 minutes SVE:   Dilation: 8 Effacement (%): 100 Station: 0 Exam by:: [email or web address] CNM   A / P: Augmentation of labor, progressing well Baby appears to be OP by [place].  -Frequent position changes.  Fetal Wellbeing:  Category I Pain Control:  Epidural  Anticipated MOD:  NSVD  Shantonette Ivonne Andrew) Danella Deis, BSN, RNC-OB  Student Nurse-Midwife   03/27/2021  8:14 PM

## 2021-03-28 ENCOUNTER — Encounter (HOSPITAL_COMMUNITY): Payer: Self-pay | Admitting: Obstetrics and Gynecology

## 2021-03-28 DIAGNOSIS — D649 Anemia, unspecified: Secondary | ICD-10-CM | POA: Diagnosis not present

## 2021-03-28 LAB — CBC
HCT: 27.6 % — ABNORMAL LOW (ref 36.0–46.0)
Hemoglobin: 9 g/dL — ABNORMAL LOW (ref 12.0–15.0)
MCH: 32.3 pg (ref 26.0–34.0)
MCHC: 32.6 g/dL (ref 30.0–36.0)
MCV: 98.9 fL (ref 80.0–100.0)
Platelets: 170 10*3/uL (ref 150–400)
RBC: 2.79 MIL/uL — ABNORMAL LOW (ref 3.87–5.11)
RDW: 13.3 % (ref 11.5–15.5)
WBC: 17.7 10*3/uL — ABNORMAL HIGH (ref 4.0–10.5)
nRBC: 0 % (ref 0.0–0.2)

## 2021-03-28 MED ORDER — METHYLERGONOVINE MALEATE 0.2 MG PO TABS
0.2000 mg | ORAL_TABLET | Freq: Once | ORAL | Status: AC
  Administered 2021-03-28: 0.2 mg via ORAL
  Filled 2021-03-28: qty 1

## 2021-03-28 MED ORDER — FERROUS SULFATE 325 (65 FE) MG PO TABS
325.0000 mg | ORAL_TABLET | Freq: Two times a day (BID) | ORAL | Status: DC
  Administered 2021-03-28 – 2021-03-29 (×3): 325 mg via ORAL
  Filled 2021-03-28 (×3): qty 1

## 2021-03-28 MED ORDER — GENTAMICIN SULFATE 40 MG/ML IJ SOLN
5.0000 mg/kg | INTRAVENOUS | Status: DC
  Filled 2021-03-28: qty 9

## 2021-03-28 MED ORDER — WITCH HAZEL-GLYCERIN EX PADS: 1.0000 "application " | MEDICATED_PAD | CUTANEOUS | Status: DC | PRN

## 2021-03-28 MED ORDER — PRENATAL MULTIVITAMIN CH
1.0000 | ORAL_TABLET | Freq: Every day | ORAL | Status: DC
  Administered 2021-03-28 – 2021-03-29 (×2): 1 via ORAL
  Filled 2021-03-28 (×2): qty 1

## 2021-03-28 MED ORDER — OXYCODONE HCL 5 MG PO TABS
5.0000 mg | ORAL_TABLET | ORAL | Status: DC | PRN
  Administered 2021-03-28 – 2021-03-29 (×6): 5 mg via ORAL
  Filled 2021-03-28 (×6): qty 1

## 2021-03-28 MED ORDER — ACETAMINOPHEN 500 MG PO TABS
1000.0000 mg | ORAL_TABLET | Freq: Four times a day (QID) | ORAL | Status: DC
  Administered 2021-03-28 – 2021-03-29 (×4): 1000 mg via ORAL
  Filled 2021-03-28 (×4): qty 2

## 2021-03-28 MED ORDER — DIPHENHYDRAMINE HCL 25 MG PO CAPS: 25.0000 mg | ORAL_CAPSULE | Freq: Four times a day (QID) | ORAL | Status: DC | PRN

## 2021-03-28 MED ORDER — COCONUT OIL OIL: 1.0000 "application " | TOPICAL_OIL | Status: DC | PRN

## 2021-03-28 MED ORDER — SIMETHICONE 80 MG PO CHEW: 80.0000 mg | CHEWABLE_TABLET | ORAL | Status: DC | PRN

## 2021-03-28 MED ORDER — GENTAMICIN SULFATE 40 MG/ML IJ SOLN
5.0000 mg/kg | Freq: Once | INTRAVENOUS | Status: AC
  Administered 2021-03-28: 360 mg via INTRAVENOUS
  Filled 2021-03-28: qty 9

## 2021-03-28 MED ORDER — TETANUS-DIPHTH-ACELL PERTUSSIS 5-2.5-18.5 LF-MCG/0.5 IM SUSY: 0.5000 mL | PREFILLED_SYRINGE | Freq: Once | INTRAMUSCULAR | Status: DC

## 2021-03-28 MED ORDER — ZOLPIDEM TARTRATE 5 MG PO TABS: 5.0000 mg | ORAL_TABLET | Freq: Every evening | ORAL | Status: DC | PRN

## 2021-03-28 MED ORDER — MAGNESIUM OXIDE -MG SUPPLEMENT 400 (240 MG) MG PO TABS
400.0000 mg | ORAL_TABLET | Freq: Every day | ORAL | Status: DC
  Administered 2021-03-28 – 2021-03-29 (×2): 400 mg via ORAL
  Filled 2021-03-28 (×2): qty 1

## 2021-03-28 MED ORDER — HYDROXYZINE HCL 25 MG PO TABS
25.0000 mg | ORAL_TABLET | Freq: Three times a day (TID) | ORAL | Status: DC | PRN
  Administered 2021-03-28 – 2021-03-29 (×2): 25 mg via ORAL
  Filled 2021-03-28 (×2): qty 1

## 2021-03-28 MED ORDER — ACETAMINOPHEN 325 MG PO TABS
650.0000 mg | ORAL_TABLET | ORAL | Status: DC
  Administered 2021-03-28 (×2): 650 mg via ORAL
  Filled 2021-03-28 (×2): qty 2

## 2021-03-28 MED ORDER — ONDANSETRON HCL 4 MG PO TABS: 4.0000 mg | ORAL_TABLET | ORAL | Status: DC | PRN

## 2021-03-28 MED ORDER — IBUPROFEN 600 MG PO TABS
600.0000 mg | ORAL_TABLET | Freq: Four times a day (QID) | ORAL | Status: DC
  Administered 2021-03-28 (×2): 600 mg via ORAL
  Filled 2021-03-28 (×2): qty 1

## 2021-03-28 MED ORDER — SENNOSIDES-DOCUSATE SODIUM 8.6-50 MG PO TABS
2.0000 | ORAL_TABLET | Freq: Every day | ORAL | Status: DC
  Administered 2021-03-28 – 2021-03-29 (×2): 2 via ORAL
  Filled 2021-03-28 (×2): qty 2

## 2021-03-28 MED ORDER — METHYLERGONOVINE MALEATE 0.2 MG PO TABS: 0.2000 mg | ORAL_TABLET | ORAL | Status: DC | PRN

## 2021-03-28 MED ORDER — BENZOCAINE-MENTHOL 20-0.5 % EX AERO
1.0000 "application " | INHALATION_SPRAY | CUTANEOUS | Status: DC | PRN
  Filled 2021-03-28: qty 56

## 2021-03-28 MED ORDER — DIBUCAINE (PERIANAL) 1 % EX OINT: 1.0000 "application " | TOPICAL_OINTMENT | CUTANEOUS | Status: DC | PRN

## 2021-03-28 MED ORDER — IBUPROFEN 800 MG PO TABS
800.0000 mg | ORAL_TABLET | Freq: Four times a day (QID) | ORAL | Status: DC
  Administered 2021-03-28 – 2021-03-29 (×5): 800 mg via ORAL
  Filled 2021-03-28 (×5): qty 1

## 2021-03-28 MED ORDER — CLINDAMYCIN PHOSPHATE 900 MG/50ML IV SOLN
900.0000 mg | Freq: Three times a day (TID) | INTRAVENOUS | Status: AC
  Administered 2021-03-28 – 2021-03-29 (×3): 900 mg via INTRAVENOUS
  Filled 2021-03-28 (×3): qty 50

## 2021-03-28 MED ORDER — ONDANSETRON HCL 4 MG/2ML IJ SOLN: 4.0000 mg | INTRAMUSCULAR | Status: DC | PRN

## 2021-03-28 MED ORDER — METHYLERGONOVINE MALEATE 0.2 MG/ML IJ SOLN: 0.2000 mg | INTRAMUSCULAR | Status: DC | PRN

## 2021-03-28 NOTE — Progress Notes (Signed)
Called by nursing re: pain not relieved with current medications  Difficult for pt to express location of pain; currently tylenol/ibuprofen/oxycodone for pain ?vaginal pain; no worsening of pain today, burning sensation, worse with movement   Temp:  [97.7 F (36.5 C)-98.7 F (37.1 C)] 97.7 F (36.5 C) (06/13 1700) Pulse Rate:  [63-119] 68 (06/13 1700) Resp:  [16-18] 16 (06/13 1700) BP: (99-129)/(45-93) 105/50 (06/13 1700) SpO2:  [99 %-100 %] 99 % (06/13 1234)  A&ox3, no distress; sitting comfortably with baby Nml respirations Abd: soft, nt, nd Perineum: normal appearing; 1st degree laceration noted: no erythema, well approximated, no drainage; gentle exam slowly touching perineum then vagina in a systematic fashion - only tenderness noted is directly on laceration and pt identified this as her pain  A/P 1st degree laceration: reviewed normal appearance, not bleeding and not infected; pt reassured; described with pt/mother/sig other the location and size of laceration; pain with movement is likely d/t rubbing of vaginal tissue; discussed ways to move around to minimize aggrevating pain; consider sitting in chair/couch, keeping legs together so not rubbing, etc; pt verbalized understanding; also discussed dermaplast and ice packs will also help; contin oral meds; mother expressed concern that narcotic was being taken away. Reviewed pain management plan and that not taking away narcotic.  Ok for prn use for pain not relieved by tylenol/ibuprofen; Mother voiced frustration about nursing not being understanding with her daughter's pain; patient did not feels this same frustration and just prior to me leaving complemented the nurse; questions answered, reviewed many ways to help minimize pain (moving certain ways, minimizing pulling at area); after discussion, pt felt comfortable knowing etiology of her pain and how to best manage pain and minimze; also encouraged sleep at pt had not had much sleep;   face to face in counseling, exam

## 2021-03-28 NOTE — Progress Notes (Signed)
RN called MD to clarify pt methergine orders. RN given new verbal orders to give x1 additional dose, at the 6 hour mark of last dose. RN will continue to monitor pt's bleeding.  Herbert Moors, RN

## 2021-03-28 NOTE — Social Work (Signed)
CSW received consult for hx of Anxiety.  CSW met with MOB to offer support and complete assessment.     CSW introduced self and role. CSW observed MOB breast feeding and FOB also bedside. CSW offered to return, MOB declined and stated FOB could also remain in the room. CSW informed MOB of reason for consult and assessed current emotions. MOB was welcoming and smiled as she stated she is doing well. MOB shared she also had a good pregnancy. CSW inquired on MOB mental health history. MOB reported she has never been diagnosed with anxiety, however there have been some general stressors. MOB disclosed she took Prozac for a small amount of time prior to pregnancy and found it to be helpful. MOB stated she has never been treated by therapy. MOB identified FOB, her parents, brother and in-laws as great supports. MOB denies any current SI or HI.   CSW provided education regarding the baby blues period versus PPD and provided resources. CSW provided the New Mom Checklist and encouraged MOB to self evaluate and contact a medical professional if symptoms are noted at any time.   CSW provided review of Sudden Infant Death Syndrome (SIDS) precautions. MOB reported they ha all essentials for infant, including a bassinet and car seat. MOB identified [facility] for follow-up care and denies any barriers to care. MOB reported she has no additional needs at this time.   CSW identifies no further need for intervention and no barriers to discharge at this time.  Darra Lis, [place] [employer] [phone]

## 2021-03-28 NOTE — Lactation Note (Signed)
This note was copied from a baby's chart. Lactation Consultation Note  Patient Name: Anna [name] [id number] Date: 03/28/2021 Reason for consult: Follow-up assessment;Early term 37-38.6wks;Primapara;1st time breastfeeding Age:32 hours   P1 mother whose infant is now 12 hours old.  This is an ETI at 38+5 weeks.  RN assisted with latching baby to the breast approximately 30 minutes ago.  Baby fed for four minutes. Mother reported baby had a good feeding at 0200 this morning.  Assisted and observed mother changing her baby's diaper; reviewed voids/stools.  Reviewed basic newborn and breast feeding education.  Encouraged lots of STS and hand expression before/after feedings to help ensure a good milk supply. Mother was excited to report that she is able to express colostrum and obtained 5 mls at her last session. Baby was spoon fed.  Praised mother for her efforts.  Reviewed feeding cues.  Suggested mother continue to call her RN/LC for latch assistance as needed.  Mother feeling hopeful that she will be able to latch and breast feed, however, also informed me that she will do "whatever it takes" to be sure her baby is fed.  Reassurance provided.  Mom made aware of O/P services, breastfeeding support groups, community resources, and our phone # for post-discharge questions.  Mother has a DEBP for home use.  Father present and supportive.     Maternal Data Has patient been taught Hand Expression?: Yes Does the patient have breastfeeding experience prior to this delivery?: No  Feeding Mother's Current Feeding Choice: Breast Milk  LATCH Score Latch: Repeated attempts needed to sustain latch, nipple held in mouth throughout feeding, stimulation needed to elicit sucking reflex.  Audible Swallowing: A few with stimulation  Type of Nipple: Flat  Comfort (Breast/Nipple): Soft / non-tender  Hold (Positioning): Full assist, staff holds infant at breast  LATCH Score: 5   Lactation Tools  Discussed/Used Tools: Shells;Nipple Shields Nipple shield size: 20  Interventions    Discharge Pump: Personal WIC Program: No  Consult Status Consult Status: Follow-up Date: 03/29/21 Follow-up type: In-patient    Dora Sims 03/28/2021, 12:36 PM

## 2021-03-28 NOTE — Anesthesia Postprocedure Evaluation (Signed)
Anesthesia Post Note  Patient: Anna [name]  Procedure(s) Performed: AN AD HOC LABOR EPIDURAL     Patient location during evaluation: Mother Baby Anesthesia Type: Epidural Level of consciousness: awake Pain management: satisfactory to patient Vital Signs Assessment: post-procedure vital signs reviewed and stable Respiratory status: spontaneous breathing Cardiovascular status: stable Anesthetic complications: no   No notable events documented.  Last Vitals:  Vitals:   03/28/21 0850 03/28/21 1234  BP: 99/64 106/82  Pulse: 85 63  Resp: 16 16  Temp: 36.8 C 36.5 C  SpO2: 100% 99%    Last Pain:  Vitals:   03/28/21 1438  TempSrc:   PainSc: 6    Pain Goal: Patients Stated Pain Goal: 3 (03/28/21 1438)                 Cephus Shelling

## 2021-03-28 NOTE — Progress Notes (Signed)
Called Meredith Sigmon CNM to discuss pt's pain management. Meds and pain level are reviewed. Perineum assessment is unchanged.The patient and her family requested that I call Sharyl Nimrod regarding patient's pain. Sharyl Nimrod will consult with Dr Amado Nash.

## 2021-03-28 NOTE — Lactation Note (Signed)
This note was copied from a baby's chart. Lactation Consultation Note  Patient Name: Anna [name] [id number] Date: 03/28/2021 Reason for consult: Follow-up assessment;Mother's request;Difficult latch;Primapara;1st time breastfeeding;Early term 37-38.6wks Age:32 hours Infant struggling with latching. LC did some suck training. Infant sensitive gag reflex with suck training and chin tug able to improve latch at breast. Infant took 2 ml of EBM via finger and curve tip syringe.   Infant able to latch on right breast with 20 NS for 10 minutes with breast compression. Infant does pop on and off but able to extend time at the breast in football compared to cross cradle.   Provider arrived to do an exam on the mother. LC alerted RN Toney Reil to assess flange size and get Mom pumping on dEBP since she is using a NS barrier to let down.   Plan 1. To feed based on cues 8-12x in 24 hr period no more than 4 hrs without an attempt. Mom to offer both breasts and look for signs of milk transfer. Infant will latch but nipples are short shafted hard to sustain it without the NS.            2. Mom to pre pump 5-10 minutes before latching and or use her breast shells ( not pumping, sleeping or nursing)         3. Mom to pump with DEBP q 3 hrs for 15 minutes  All questions answered at the end of the visit.   Maternal Data Has patient been taught Hand Expression?: Yes Does the patient have breastfeeding experience prior to this delivery?: No  Feeding Mother's Current Feeding Choice: Breast Milk  LATCH Score Latch: Repeated attempts needed to sustain latch, nipple held in mouth throughout feeding, stimulation needed to elicit sucking reflex.  Audible Swallowing: A few with stimulation  Type of Nipple: Flat  Comfort (Breast/Nipple): Soft / non-tender  Hold (Positioning): Assistance needed to correctly position infant at breast and maintain latch.  LATCH Score: 6   Lactation Tools Discussed/Used Tools:  Shells;Pump;Flanges Nipple shield size: 20 Flange Size: 24 (LC did not assess flange size with arrival of provider to do a procedure. LC alerted RN, Toney Reil to check flange size after exam with mother was over) Breast pump type: Double-Electric Breast Pump Pump Education: Setup, frequency, and cleaning;Milk Storage Reason for Pumping: elognate her nipples Pumping frequency: pre pump 5-10 minutes  Interventions Interventions: Breast feeding basics reviewed;Breast compression;Assisted with latch;Adjust position;Skin to skin;Support pillows;DEBP;Breast massage;Position options;Hand express;Education;Expressed milk;Pre-pump if needed;Shells  Discharge Pump: Personal WIC Program: No  Consult Status Consult Status: Follow-up Date: 03/29/21 Follow-up type: In-patient    Anna Fretz  [name] 03/28/2021, 7:32 PM

## 2021-03-28 NOTE — Progress Notes (Addendum)
PPD #1, SVD, 1st degree perineal, partial right labial splay, s/p PPH, baby boy "[name]"  S:  Reports feeling not well, reports significant pain in perineum and cramping. Tearful during visit due to pain. Not relieved with ice packs, Dermoplast, Tylenol, and Motrin.              Tolerating po/ No nausea or vomiting / Denies dizziness or SOB             Bleeding is light, moderate             Up ad lib / ambulatory / voiding QS without difficulty   Newborn breast feeding - but has been sleepy this morning. Reports adequate colostrum / Circumcision - planning prior to d/c   O:               VS: BP 99/64   Pulse 85   Temp 98.2 F (36.8 C) (Oral)   Resp 16   Ht 5\' 4"  (1.626 m)   Wt 98.4 kg   SpO2 100%   Breastfeeding Unknown   BMI 37.25 kg/m  Patient Vitals for the past 24 hrs:  BP Temp Temp src Pulse Resp SpO2  03/28/21 0850 99/64 98.2 F (36.8 C) Oral 85 16 100 %  03/28/21 0610 109/60 98.3 F (36.8 C) Oral 68 18 100 %  03/28/21 0140 (!) 115/93 98.3 F (36.8 C) Oral 85 18 99 %  03/28/21 0015 120/69 98.6 F (37 C) Oral 75 18 99 %  03/27/21 2345 (!) 104/59 -- -- 81 -- --  03/27/21 2330 118/73 -- -- 79 -- --  03/27/21 2300 (!) 112/59 -- -- 78 -- --  03/27/21 2245 107/66 -- -- 74 -- --  03/27/21 2230 102/78 -- -- (!) 119 -- --  03/27/21 2215 114/81 -- -- 92 -- --  03/27/21 2200 (!) 129/57 -- -- (!) 109 -- --  03/27/21 2100 108/74 -- -- 92 -- --  03/27/21 2030 (!) 101/45 98.7 F (37.1 C) Oral 84 -- --  03/27/21 2000 112/78 -- -- 79 -- --  03/27/21 1930 93/77 -- -- 90 -- --  03/27/21 1900 122/72 -- -- 80 -- --  03/27/21 1831 115/69 -- -- 75 18 --  03/27/21 1801 (!) 95/57 98.1 F (36.7 C) Oral 81 18 --  03/27/21 1719 117/68 -- -- 82 -- --  03/27/21 1640 127/74 -- -- 84 18 --  03/27/21 1606 119/70 -- -- 85 18 --  03/27/21 1600 117/64 98 F (36.7 C) Oral 86 16 --  03/27/21 1556 115/68 -- -- 89 16 --  03/27/21 1551 96/70 -- -- 85 16 --  03/27/21 1546 121/75 -- -- 84 18 --   03/27/21 1540 123/72 -- -- (!) 106 18 --  03/27/21 1535 130/72 -- -- 94 -- --  03/27/21 1529 (!) 136/91 -- -- 88 18 --  03/27/21 1525 123/85 -- -- 86 -- --  03/27/21 1452 95/81 -- -- 74 18 --  03/27/21 1418 124/87 98.2 F (36.8 C) Oral 73 18 --  03/27/21 1322 102/64 -- -- 71 18 --  03/27/21 1208 114/61 98.2 F (36.8 C) Oral 81 18 --  03/27/21 1108 124/74 -- -- 86 18 --  03/27/21 1030 119/67 -- -- 92 18 --     LABS:              Recent Labs    03/27/21 0643 03/28/21 0415  WBC 10.9* 17.7*  HGB 10.4* 9.0*  PLT 185  170               Blood type: --/--/A POS (06/12 2951)  Rubella: Immune (11/24 0000)                     I&O: Intake/Output      06/12 0701 06/13 0700 06/13 0701 06/14 0700   I.V. (mL/kg) 0 (0)    Other 0    IV Piggyback 0    Total Intake(mL/kg) 0 (0)    Urine (mL/kg/hr) 1525 (0.6)    Blood 683    Total Output 2208    Net -2208          Flu: n/a Tdap: UTD Covid: UTD              Physical Exam:             Alert and oriented X3  Lungs: Clear and unlabored  Heart: regular rate and rhythm / no murmurs  Abdomen: soft, non-tender, non-distended              Fundus: firm, non-tender, U-3  Perineum: well approximated 1st degree, mild edema and erythema, no evidence of hematoma   Lochia: appropriate, light rubra, no clots   Extremities: trace LE edema, no calf pain or tenderness    A/P: PPD # 1, SVD S/p PPH with ABL Anemia compounding chronic IDA - Received Pitocin, Methergine, and TXA immediately after delivery and finished PO Methergine dose x 1  - stable on oral FE BID 1st degree and R labial splay    - comfort measures reviewed; side lying positions  Increase Motrin 800mg  every 6hrs and Tylenol 1000mg  every 6 hrs Add Oxycodone IR 5mg  every 4hrs PRN for severe pain  Doing well - stable status Routine post partum orders  Encouraged frequent feeds every 2-3 hrs; lactation support PRN  Ambulation encouraged and void every 2-3 hrs   Plan for circ  tomorrow per peds  Anticipate d/c home tomorrow   , MSN, CNM [facility]    Addendum at 12:30pm: Updated Dr. on patient status. In reviewing labs, exam this morning, and labor course, Dr. recommends treatment for presumptive chorioamnionitis. Orders placed for Gentamycin 5mg /kg x1 dose and Clindamycin 900mg  q8hrs x 24 hours. RN notified of plan. RN updated CNM stating patient's pain was improving with Oxycodone IR 5mg  x 1 dose. Continue to monitor closely.   POC in consult with Dr. Carlean Jews, CNM

## 2021-03-29 DIAGNOSIS — F419 Anxiety disorder, unspecified: Secondary | ICD-10-CM | POA: Diagnosis present

## 2021-03-29 MED ORDER — BENZOCAINE-MENTHOL 20-0.5 % EX AERO: 1.0000 "application " | INHALATION_SPRAY | CUTANEOUS | 1 refills | Status: DC | PRN

## 2021-03-29 MED ORDER — IBUPROFEN 800 MG PO TABS: 800.0000 mg | ORAL_TABLET | Freq: Four times a day (QID) | ORAL | 0 refills | Status: DC

## 2021-03-29 MED ORDER — ACETAMINOPHEN 500 MG PO TABS: 1000.0000 mg | ORAL_TABLET | Freq: Four times a day (QID) | ORAL | 0 refills | Status: DC

## 2021-03-29 MED ORDER — MAGNESIUM OXIDE -MG SUPPLEMENT 400 (240 MG) MG PO TABS: 400.0000 mg | ORAL_TABLET | Freq: Every day | ORAL | 1 refills | Status: DC

## 2021-03-29 MED ORDER — WITCH HAZEL-GLYCERIN EX PADS: 1.0000 "application " | MEDICATED_PAD | CUTANEOUS | 12 refills | Status: DC | PRN

## 2021-03-29 MED ORDER — OXYCODONE HCL 5 MG PO TABS: 5.0000 mg | ORAL_TABLET | ORAL | 0 refills | Status: DC | PRN

## 2021-03-29 MED ORDER — POLYSACCHARIDE IRON COMPLEX 150 MG PO CAPS: 150.0000 mg | ORAL_CAPSULE | Freq: Every day | ORAL | 1 refills | Status: AC

## 2021-03-29 NOTE — Lactation Note (Signed)
This note was copied from a baby's chart. Lactation Consultation Note  Patient Name: Anna [name] Date: 03/29/2021 Reason for consult: Follow-up assessment Age:32 Hours  Mother paged [facility] for feeding assistance. Mother had infant latched on the breast when I arrived to the room. Parents report that infant had been breastfeeding for 3 mins. Observed infant with good suck/ swallow pattern for 15 mins.  Assist mother with placing infant on alternate breast and infant sustaind latch for another 18 mins. Mother denied pain . Her nipple was round and not miss shaped when infant released the breast. Infant was given 3 ml of ebm with a gloved finger and curved tip syringe.  Teaching on use of a SNS at the breast. Mother was given a #5 fr feeding tube/ with instructions on use when she gets home .  Mother to continue to hand express, pump every 2-3 hours for 15-20 mins. Mother has supplemental guidelines Made aware that she may have to use formula as supplement if unable to pump enough or if unable to get infant to have a good feeding.   Mother to continue to cue base feed infant and feed at least 8-12 times or more in 24 hours and advised to allow for cluster feeding infant as needed.  Mother to continue to due STS. Mother is aware of available LC services at [facility], BFSG'S, OP Dept, and phone # for questions or concerns about breastfeeding.  Mother receptive to all teaching and plan of care.   OP  appt request sent in Epic.  Maternal Data    Feeding Mother's Current Feeding Choice: Breast Milk  LATCH Score Latch: Grasps breast easily, tongue down, lips flanged, rhythmical sucking.  Audible Swallowing: Spontaneous and intermittent  Type of Nipple: Everted at rest and after stimulation  Comfort (Breast/Nipple): Soft / non-tender  Hold (Positioning): Assistance needed to correctly position infant at breast and maintain latch.  LATCH Score: 9   Lactation Tools Discussed/Used     Interventions    Discharge Discharge Education: Engorgement and breast care;Warning signs for feeding baby;Outpatient recommendation  Consult Status Consult Status: Complete Date: 03/29/21 Follow-up type: In-patient    Stevan Born [facility] 03/29/2021, 3:20 PM

## 2021-03-29 NOTE — Lactation Note (Signed)
This note was copied from a baby's chart. Lactation Consultation Note  Patient Name: Boy Adianna Darwin [id number] Date: 03/29/2021 Reason for consult: Follow-up assessment Age:30 years  Mother is a P1 , ETI infant is feeding on and off at the breast. He has been spoon fed several times 2-5 ml of colostrum. Mother just started pumping with the DEBP. She does like the harmony hand pump and has used to pump 5 ml two times.  Mother has a Luna Motif pump with her.  Attempt to assist mother with latching infant multiple times in several positions. No latch sustained. Observed that infant has a heart shaped tongue that rolls like a bowl when crying.  Mother has a hand pump to use and is effective with hand expressing.   Plan of Care : Breastfeed infant with feeding cues Supplement infant with ebm/ according to supplemental guidelines. Pump using a DEBP after each feeding for 15-20 mins.   Mother to continue to cue base feed infant and feed at least 8-12 times or more in 24 hours and advised to allow for cluster feeding infant as needed.   Mother to continue to due STS. Mother is aware of available LC services at [facility], BFSG'S, OP Dept, and phone # for questions or concerns about breastfeeding.  Mother receptive to all teaching and plan of care.   Maternal Data    Feeding Mother's Current Feeding Choice: Breast Milk  LATCH Score Latch: Grasps breast easily, tongue down, lips flanged, rhythmical sucking.  Audible Swallowing: Spontaneous and intermittent  Type of Nipple: Everted at rest and after stimulation  Comfort (Breast/Nipple): Filling, red/small blisters or bruises, mild/mod discomfort  Hold (Positioning): Assistance needed to correctly position infant at breast and maintain latch.  LATCH Score: 8   Lactation Tools Discussed/Used    Interventions    Discharge    Consult Status      Stevan Born [facility] 03/29/2021, 11:04 AM

## 2021-03-29 NOTE — Discharge Instructions (Signed)
Lactation Outpatient Support   Linda Coppola RN, MHA, IBCLC at [facility]: Lactation Consultant  [email or web address] Mail: [email or web address] Tel: [phone]  

## 2021-03-29 NOTE — Lactation Note (Signed)
This note was copied from a baby's chart. Lactation Consultation Note  Patient Name: Boy Ahliya Glatt [id number] Date: 03/29/2021 Reason for consult: Follow-up assessment Age:32 Hours Assist mother to attempt to feed infant . Infant too sleepy to latch . Advised mother to hand express and to pump with the hand pump or the DEBP . The Peds MD wants infant to have a feeding before and and after circumcision . Mother to page [facility] when infant begins to cue within the next hour.     Maternal Data    Feeding Mother's Current Feeding Choice: Breast Milk  LATCH Score Latch: Grasps breast easily, tongue down, lips flanged, rhythmical sucking.  Audible Swallowing: Spontaneous and intermittent  Type of Nipple: Everted at rest and after stimulation  Comfort (Breast/Nipple): Soft / non-tender  Hold (Positioning): Assistance needed to correctly position infant at breast and maintain latch.  LATCH Score: 9   Lactation Tools Discussed/Used    Interventions    Discharge Discharge Education: Engorgement and breast care;Warning signs for feeding baby;Outpatient recommendation  Consult Status Consult Status: Complete Date: 03/29/21 Follow-up type: In-patient    Stevan Born [facility] 03/29/2021, 3:07 PM

## 2021-03-29 NOTE — Discharge Summary (Signed)
OB Discharge Summary  Patient Name: Anna [name] DOB: 05-04-1991 MRN: [id number]  Date of admission: 03/27/2021 Delivering provider: Carlynn Herald   Admitting diagnosis: PROM (premature rupture of membranes) [O42.90] Intrauterine pregnancy: [redacted]w[redacted]d     Secondary diagnosis: Patient Active Problem List   Diagnosis Date Noted   Anxiety 03/29/2021   First degree perineal laceration 03/28/2021   SVD (spontaneous vaginal delivery) 03/28/2021   Obstetrical laceration: right labial splay 03/28/2021   Acute on chronic anemia 03/28/2021   PROM (premature rupture of membranes) 03/27/2021   Postpartum care following vaginal delivery (6/12) 03/27/2021   ABLA (acute blood loss anemia) 03/27/2021    Date of discharge: 03/29/2021   Discharge diagnosis: Principal Problem:   Postpartum care following vaginal delivery (6/12) Active Problems:   PROM (premature rupture of membranes)   ABLA (acute blood loss anemia)   First degree perineal laceration   SVD (spontaneous vaginal delivery)   Obstetrical laceration: right labial splay   Acute on chronic anemia   Anxiety                                                             Post partum procedures: None  Augmentation: Pitocin Pain control: Epidural  Laceration:1st degree;Labial  Episiotomy:None  Complications: None  Hospital course:  Onset of Labor With Vaginal Delivery      30 y.o. yo G1P1001 at [redacted]w[redacted]d was admitted in Latent Labor on 03/27/2021. Patient had an uncomplicated labor course as follows:  Membrane Rupture Time/Date: 4:00 AM ,03/27/2021   Delivery Method:Vaginal, Spontaneous  Episiotomy: None  Lacerations:  1st degree;Labial  Patient had an uncomplicated postpartum course.  She is ambulating, tolerating a regular diet, passing flatus, and urinating well. Patient is discharged home in stable condition on 03/29/21.  Newborn Data: Birth date:03/27/2021  Birth time:9:47 PM  Gender:Female  Living status:Living  Apgars:8 ,9   Weight:3450 g   Physical exam  Vitals:   03/28/21 1234 03/28/21 1700 03/28/21 2218 03/29/21 0512  BP: 106/82 (!) 105/50 108/71 102/63  Pulse: 63 68 72 72  Resp: 16 16 18 18   Temp: 97.7 F (36.5 C) 97.7 F (36.5 C) 98 F (36.7 C) 98.1 F (36.7 C)  TempSrc: Oral Oral Oral Oral  SpO2: 99%   99%  Weight:      Height:       General: alert, cooperative, and no distress Lochia: appropriate Uterine Fundus: firm Perineum: repair intact, no edema DVT Evaluation: No evidence of DVT seen on physical exam. No significant calf/ankle edema. Labs: Lab Results  Component Value Date   WBC 17.7 (H) 03/28/2021   HGB 9.0 (L) 03/28/2021   HCT 27.6 (L) 03/28/2021   MCV 98.9 03/28/2021   PLT 170 03/28/2021   CMP Latest Ref Rng & Units 03/13/2019  Glucose 65 - 99 mg/dL 81  BUN 6 - 20 mg/dL 15  Creatinine 03/15/2019 - 8.41 mg/dL 6.60  Sodium 6.30 - 160 mmol/L 140  Potassium 3.5 - 5.2 mmol/L 4.5  Chloride 96 - 106 mmol/L 102  CO2 20 - 29 mmol/L 24  Calcium 8.7 - 10.2 mg/dL 9.2  Total Protein 6.0 - 8.5 g/dL 6.7  Total Bilirubin 0.0 - 1.2 mg/dL 0.5  Alkaline Phos 39 - 117 IU/L 39  AST 0 - 40 IU/L 17  ALT  0 - 32 IU/L 14   No flowsheet data found.  Vaccines: TDaP UTD         COVID-19   UTD  Discharge instructions:  per After Visit Summary  After Visit Meds:  Allergies as of 03/29/2021   No Known Allergies      Medication List     STOP taking these medications    ondansetron 4 MG disintegrating tablet Commonly known as: ZOFRAN-ODT       TAKE these medications    acetaminophen 500 MG tablet Commonly known as: TYLENOL Take 2 tablets (1,000 mg total) by mouth every 6 (six) hours.   benzocaine-Menthol 20-0.5 % Aero Commonly known as: DERMOPLAST Apply 1 application topically as needed for irritation (perineal discomfort).   FLUoxetine 20 MG tablet Commonly known as: PROZAC Take 1 tablet (20 mg total) by mouth daily. **NEEDS APT FOR FURTHER REFILLS**   ibuprofen 800 MG  tablet Commonly known as: ADVIL Take 1 tablet (800 mg total) by mouth every 6 (six) hours.   iron polysaccharides 150 MG capsule Commonly known as: Ferrex 150 Take 1 capsule (150 mg total) by mouth daily.   magnesium oxide 400 (240 Mg) MG tablet Commonly known as: MAG-OX Take 1 tablet (400 mg total) by mouth daily.   oxyCODONE 5 MG immediate release tablet Commonly known as: Oxy IR/ROXICODONE Take 1 tablet (5 mg total) by mouth every 4 (four) hours as needed for severe pain.   witch hazel-glycerin pad Commonly known as: TUCKS Apply 1 application topically as needed for hemorrhoids.       Diet: routine diet  Activity: Advance as tolerated. Pelvic rest for 6 weeks.   Newborn Data: Live born female  Birth Weight: 7 lb 9.7 oz (3450 g) APGAR: 8, 9  Newborn Delivery   Birth date/time: 03/27/2021 21:47:00 Delivery type: Vaginal, Spontaneous     Named [name] Baby Feeding: Breast Disposition:home with mother  Delivery Report:  Review the Delivery Report for details.    Follow up:  Follow-up Information     Shea Evans, MD. Schedule an appointment as soon as possible for a visit in 2 week(s).   Specialty: Obstetrics and Gynecology Contact information: [address] [place] [address] [phone]                Clancy Gourd, MSN 03/29/2021, 11:18 AM

## 2021-09-20 DIAGNOSIS — Z800 Family history of malignant neoplasm of digestive organs: Secondary | ICD-10-CM | POA: Diagnosis not present

## 2021-09-20 DIAGNOSIS — Z1211 Encounter for screening for malignant neoplasm of colon: Secondary | ICD-10-CM | POA: Diagnosis not present

## 2021-09-20 DIAGNOSIS — K625 Hemorrhage of anus and rectum: Secondary | ICD-10-CM | POA: Diagnosis not present

## 2021-09-23 DIAGNOSIS — Z1211 Encounter for screening for malignant neoplasm of colon: Secondary | ICD-10-CM | POA: Diagnosis not present

## 2021-09-23 DIAGNOSIS — Z800 Family history of malignant neoplasm of digestive organs: Secondary | ICD-10-CM | POA: Diagnosis not present

## 2021-09-23 DIAGNOSIS — D125 Benign neoplasm of sigmoid colon: Secondary | ICD-10-CM | POA: Diagnosis not present

## 2021-09-23 DIAGNOSIS — K625 Hemorrhage of anus and rectum: Secondary | ICD-10-CM | POA: Diagnosis not present

## 2021-09-23 DIAGNOSIS — K635 Polyp of colon: Secondary | ICD-10-CM | POA: Diagnosis not present

## 2021-10-16 HISTORY — PX: BILATERAL SALPINGECTOMY: SHX5743

## 2021-10-20 DIAGNOSIS — F418 Other specified anxiety disorders: Secondary | ICD-10-CM | POA: Diagnosis not present

## 2021-12-20 DIAGNOSIS — K602 Anal fissure, unspecified: Secondary | ICD-10-CM | POA: Diagnosis not present

## 2021-12-20 DIAGNOSIS — R1033 Periumbilical pain: Secondary | ICD-10-CM | POA: Diagnosis not present

## 2021-12-20 DIAGNOSIS — K625 Hemorrhage of anus and rectum: Secondary | ICD-10-CM | POA: Diagnosis not present

## 2021-12-20 DIAGNOSIS — R110 Nausea: Secondary | ICD-10-CM | POA: Diagnosis not present

## 2021-12-21 DIAGNOSIS — R1033 Periumbilical pain: Secondary | ICD-10-CM | POA: Diagnosis not present

## 2021-12-21 DIAGNOSIS — K602 Anal fissure, unspecified: Secondary | ICD-10-CM | POA: Diagnosis not present

## 2021-12-21 DIAGNOSIS — K625 Hemorrhage of anus and rectum: Secondary | ICD-10-CM | POA: Diagnosis not present

## 2021-12-21 DIAGNOSIS — R110 Nausea: Secondary | ICD-10-CM | POA: Diagnosis not present

## 2021-12-22 ENCOUNTER — Other Ambulatory Visit: Payer: Self-pay | Admitting: Gastroenterology

## 2021-12-22 DIAGNOSIS — R112 Nausea with vomiting, unspecified: Secondary | ICD-10-CM

## 2021-12-23 ENCOUNTER — Ambulatory Visit
Admission: RE | Admit: 2021-12-23 | Discharge: 2021-12-23 | Disposition: A | Discharge status: Home / Self Care | Payer: BC Managed Care – PPO | Source: Ambulatory Visit | Attending: Gastroenterology | Admitting: Gastroenterology

## 2021-12-23 DIAGNOSIS — K824 Cholesterolosis of gallbladder: Secondary | ICD-10-CM | POA: Diagnosis not present

## 2021-12-23 DIAGNOSIS — R140 Abdominal distension (gaseous): Secondary | ICD-10-CM | POA: Diagnosis not present

## 2021-12-23 DIAGNOSIS — R112 Nausea with vomiting, unspecified: Secondary | ICD-10-CM

## 2022-01-13 DIAGNOSIS — M9904 Segmental and somatic dysfunction of sacral region: Secondary | ICD-10-CM | POA: Diagnosis not present

## 2022-01-13 DIAGNOSIS — M9902 Segmental and somatic dysfunction of thoracic region: Secondary | ICD-10-CM | POA: Diagnosis not present

## 2022-01-13 DIAGNOSIS — M9903 Segmental and somatic dysfunction of lumbar region: Secondary | ICD-10-CM | POA: Diagnosis not present

## 2022-01-13 DIAGNOSIS — M5416 Radiculopathy, lumbar region: Secondary | ICD-10-CM | POA: Diagnosis not present

## 2022-01-18 DIAGNOSIS — M9903 Segmental and somatic dysfunction of lumbar region: Secondary | ICD-10-CM | POA: Diagnosis not present

## 2022-01-18 DIAGNOSIS — M5416 Radiculopathy, lumbar region: Secondary | ICD-10-CM | POA: Diagnosis not present

## 2022-01-18 DIAGNOSIS — M9902 Segmental and somatic dysfunction of thoracic region: Secondary | ICD-10-CM | POA: Diagnosis not present

## 2022-01-18 DIAGNOSIS — M9904 Segmental and somatic dysfunction of sacral region: Secondary | ICD-10-CM | POA: Diagnosis not present

## 2022-01-24 DIAGNOSIS — M9904 Segmental and somatic dysfunction of sacral region: Secondary | ICD-10-CM | POA: Diagnosis not present

## 2022-01-24 DIAGNOSIS — M9902 Segmental and somatic dysfunction of thoracic region: Secondary | ICD-10-CM | POA: Diagnosis not present

## 2022-01-24 DIAGNOSIS — M5416 Radiculopathy, lumbar region: Secondary | ICD-10-CM | POA: Diagnosis not present

## 2022-01-24 DIAGNOSIS — M9903 Segmental and somatic dysfunction of lumbar region: Secondary | ICD-10-CM | POA: Diagnosis not present

## 2022-01-31 DIAGNOSIS — M5416 Radiculopathy, lumbar region: Secondary | ICD-10-CM | POA: Diagnosis not present

## 2022-01-31 DIAGNOSIS — M9902 Segmental and somatic dysfunction of thoracic region: Secondary | ICD-10-CM | POA: Diagnosis not present

## 2022-01-31 DIAGNOSIS — M9903 Segmental and somatic dysfunction of lumbar region: Secondary | ICD-10-CM | POA: Diagnosis not present

## 2022-01-31 DIAGNOSIS — M9904 Segmental and somatic dysfunction of sacral region: Secondary | ICD-10-CM | POA: Diagnosis not present

## 2022-02-08 DIAGNOSIS — M9903 Segmental and somatic dysfunction of lumbar region: Secondary | ICD-10-CM | POA: Diagnosis not present

## 2022-02-08 DIAGNOSIS — M9904 Segmental and somatic dysfunction of sacral region: Secondary | ICD-10-CM | POA: Diagnosis not present

## 2022-02-08 DIAGNOSIS — M9902 Segmental and somatic dysfunction of thoracic region: Secondary | ICD-10-CM | POA: Diagnosis not present

## 2022-02-08 DIAGNOSIS — M5416 Radiculopathy, lumbar region: Secondary | ICD-10-CM | POA: Diagnosis not present

## 2022-02-15 DIAGNOSIS — M5416 Radiculopathy, lumbar region: Secondary | ICD-10-CM | POA: Diagnosis not present

## 2022-02-15 DIAGNOSIS — M9904 Segmental and somatic dysfunction of sacral region: Secondary | ICD-10-CM | POA: Diagnosis not present

## 2022-02-15 DIAGNOSIS — M9902 Segmental and somatic dysfunction of thoracic region: Secondary | ICD-10-CM | POA: Diagnosis not present

## 2022-02-15 DIAGNOSIS — M9903 Segmental and somatic dysfunction of lumbar region: Secondary | ICD-10-CM | POA: Diagnosis not present

## 2022-02-16 DIAGNOSIS — K8020 Calculus of gallbladder without cholecystitis without obstruction: Secondary | ICD-10-CM | POA: Diagnosis not present

## 2022-02-16 DIAGNOSIS — K921 Melena: Secondary | ICD-10-CM | POA: Diagnosis not present

## 2022-05-18 ENCOUNTER — Ambulatory Visit
Admission: EM | Admit: 2022-05-18 | Discharge: 2022-05-18 | Disposition: A | Discharge status: Home / Self Care | Payer: BC Managed Care – PPO

## 2022-05-18 DIAGNOSIS — T23039A Burn of unspecified degree of unspecified multiple fingers (nail), not including thumb, initial encounter: Secondary | ICD-10-CM

## 2022-05-18 MED ORDER — GABAPENTIN 300 MG PO CAPS: 300.0000 mg | ORAL_CAPSULE | Freq: Two times a day (BID) | ORAL | 0 refills | Status: AC

## 2022-05-18 MED ORDER — IBUPROFEN 800 MG PO TABS: 800.0000 mg | ORAL_TABLET | Freq: Three times a day (TID) | ORAL | 0 refills | Status: DC

## 2022-05-18 MED ORDER — BACITRACIN ZINC 500 UNIT/GM EX OINT: 1.0000 | TOPICAL_OINTMENT | Freq: Two times a day (BID) | CUTANEOUS | 0 refills | Status: DC

## 2022-05-18 MED ORDER — HYDROCODONE-ACETAMINOPHEN 5-325 MG PO TABS: 2.0000 | ORAL_TABLET | Freq: Two times a day (BID) | ORAL | 0 refills | Status: AC | PRN

## 2022-05-18 NOTE — Discharge Instructions (Signed)
Keep area clean.  Continue applying cold to the area for pain relief.  I have also called in ibuprofen.  Do not take NSAIDs with this medication including aspirin, ibuprofen/Advil, naproxen/Aleve.  Make sure to wash your hands regularly and apply bacitracin ointment up to 2 times a day.  If you have any signs of infection including swelling, increased pain, drainage you need to be seen immediately.  Use hydrocodone for pain relief.  This to make you sleepy so do not drive or drink alcohol while taking it.  Follow-up with wound care specialist as soon as possible.  If you have any worsening symptoms including increased pain, swelling, decreased range of motion of your fingers, numbness or tingling in your fingers you need to go to the emergency room as we discussed.

## 2022-05-18 NOTE — ED Triage Notes (Signed)
The patient states she burned the tips of her fingers on her left hand. Grabbed the hot wire in the dishwasher today around 1100.   Home interventions: cold application

## 2022-05-18 NOTE — ED Provider Notes (Signed)
[facility]    CSN: [id number] Arrival date & time: 05/18/22  1314      History   Chief Complaint Chief Complaint  Patient presents with   Burn    HPI Anna [name] is a 31 y.o. female.   Patient presents today for evaluation of a several hour history of left hand pain following thermal burn.  Reports that she was working in the kitchen when she touched the hot wire in her dishwasher and burned her thumb and 3 other fingers.  She is left-handed.  She has been keeping area cool but has not tried any additional remedies.  She is confident that she is up-to-date on her tetanus as she was pregnant in 2021 and her all her vaccines updated at that point.  She has not taken any medication for symptom management.  She is able to move her fingers and denies any numbness or paresthesias.  She is anxious to feel better as she is scheduled to leave for vacation tomorrow.  She is confident she is not pregnant.  Pain is rated 9 on a 0-10 pain scale, localized to distal finger pads of affected fingers, described as burning, alleviated with cool, no aggravating factors identified.    Past Medical History:  Diagnosis Date   Asthma     Patient Active Problem List   Diagnosis Date Noted   Anxiety 03/29/2021   First degree perineal laceration 03/28/2021   SVD (spontaneous vaginal delivery) 03/28/2021   Obstetrical laceration: right labial splay 03/28/2021   Acute on chronic anemia 03/28/2021   PROM (premature rupture of membranes) 03/27/2021   Postpartum care following vaginal delivery (6/12) 03/27/2021   ABLA (acute blood loss anemia) 03/27/2021    Past Surgical History:  Procedure Laterality Date   HAND SURGERY  2009   TYMPANOSTOMY TUBE PLACEMENT     6 sets    OB History     Gravida  1   Para  1   Term  1   Preterm      AB      Living  1      SAB      IAB      Ectopic      Multiple  0   Live Births  1            Home Medications    Prior to  Admission medications   Medication Sig Start Date End Date Taking? Authorizing Provider  bacitracin ointment Apply 1 Application topically 2 (two) times daily. 05/18/22  Yes Aviannah Castoro, Runell K, PA-C  gabapentin (NEURONTIN) 300 MG capsule Take 1 capsule (300 mg total) by mouth 2 (two) times daily. 05/18/22  Yes Kieli Golladay, Noberto Retort, PA-C  HYDROcodone-acetaminophen (NORCO/VICODIN) 5-325 MG tablet Take 2 tablets by mouth 2 (two) times daily as needed for up to 2 days. 05/18/22 05/20/22 Yes Maleia Weems, Mariaclara K, PA-C  ibuprofen (ADVIL) 800 MG tablet Take 1 tablet (800 mg total) by mouth 3 (three) times daily. 05/18/22  Yes Reyn Faivre, Noberto Retort, PA-C  norethindrone (MICRONOR) 0.35 MG tablet Take 1 tablet by mouth daily.   Yes [provider]  acetaminophen (TYLENOL) 500 MG tablet Take 2 tablets (1,000 mg total) by mouth every 6 (six) hours. 03/29/21   June Leap, CNM  iron polysaccharides (FERREX 150) 150 MG capsule Take 1 capsule (150 mg total) by mouth daily. 03/29/21   June Leap, CNM  sertraline (ZOLOFT) 50 MG tablet Take 50 mg by mouth daily.  05/01/22   [provider]  JUNEL FE 1/20 1-20 MG-MCG tablet Take 1 tablet by mouth daily. 12/25/16 03/27/21  [provider]    Family History History reviewed. No pertinent family history.  Social History Social History   Tobacco Use   Smoking status: Never   Smokeless tobacco: Never  Substance Use Topics   Alcohol use: Not Currently    Comment: socially   Drug use: Never     Allergies   Patient has no known allergies.   Review of Systems Review of Systems  Constitutional:  Positive for activity change. Negative for appetite change, fatigue and fever.  Musculoskeletal:  Positive for myalgias. Negative for arthralgias.  Skin:  Positive for color change and wound.  Neurological:  Negative for weakness and numbness.     Physical Exam Triage Vital Signs ED Triage Vitals  Enc Vitals Group     BP 05/18/22 1403 115/74     Pulse Rate  05/18/22 1403 61     Resp 05/18/22 1403 18     Temp 05/18/22 1403 97.8 F (36.6 C)     Temp Source 05/18/22 1403 Oral     SpO2 05/18/22 1403 98 %     Weight --      Height --      Head Circumference --      Peak Flow --      Pain Score 05/18/22 1411 9     Pain Loc --      Pain Edu? --      Excl. in GC? --    No data found.  Updated Vital Signs BP 115/74 (BP Location: Right Arm)   Pulse 61   Temp 97.8 F (36.6 C) (Oral)   Resp 18   LMP 05/04/2022   SpO2 98%   Breastfeeding No   Visual Acuity Right Eye Distance:   Left Eye Distance:   Bilateral Distance:    Right Eye Near:   Left Eye Near:    Bilateral Near:     Physical Exam Vitals reviewed.  Constitutional:      General: She is awake. She is not in acute distress.    Appearance: Normal appearance. She is well-developed. She is not ill-appearing.     Comments: Very pleasant female appears stated age in no acute distress sitting comfortably in exam room  HENT:     Head: Normocephalic and atraumatic.  Cardiovascular:     Rate and Rhythm: Normal rate and regular rhythm.     Heart sounds: Normal heart sounds, S1 normal and S2 normal. No murmur heard.    Comments: Capillary refill within 2 seconds left fingers Pulmonary:     Effort: Pulmonary effort is normal.     Breath sounds: Normal breath sounds. No wheezing, rhonchi or rales.     Comments: Clear to auscultation bilaterally Abdominal:     Palpations: Abdomen is soft.     Tenderness: There is no abdominal tenderness.  Musculoskeletal:     Left hand: No swelling, tenderness or bony tenderness. Normal range of motion. There is no disruption of two-point discrimination. Normal capillary refill.     Comments: Left hand: Burn noted distal finger pads.  Hands neurovascularly intact.  Normal active range of motion at all joints.  No burn involvement of joints.  Skin:    Findings: Burn present.  Psychiatric:        Behavior: Behavior is cooperative.          UC Treatments / Results  Labs (all labs ordered are listed, but only abnormal results are displayed) Labs Reviewed - No data to display  EKG   Radiology No results found.  Procedures Procedures (including critical care time)  Medications Ordered in UC Medications - No data to display  Initial Impression / Assessment and Plan / UC Course  I have reviewed the triage vital signs and the nursing notes.  Pertinent labs & imaging results that were available during my care of the patient were reviewed by me and considered in my medical decision making (see chart for details).     Total surface area of approximately 1-2%.  No involvement of joints.  Discussed that I did not think she needed emergent evaluation at this time.  She was encouraged to keep area cool.  Discussed importance of pain control.  She was prescribed ibuprofen 800 mg up to 3 times a day.  Discussed that she should not take additional NSAIDs with this medication due to risk of GI bleeding.  A few doses of hydrocodone were sent to pharmacy but she was instructed not to drive or drink alcohol while taking these medications as drowsiness is a common side effect.  Review of [place] controlled substance database shows no inappropriate refills.  Patient reports that she was previously taking gabapentin and is hoping this may provide some additional pain relief.  She is requesting a refill of this medication.  Discussed that she should keep this area clean to avoid any kind of infection.  She was prescribed bacitracin to be used with dressing changes.  Discussed that she should follow-up with wound specialist.  She was given contact information with instruction to call to schedule an appointment.  Discussed that if she has any worsening symptoms including increased pain, inability to manage pain with prescribed medication, numbness, tingling, swelling, decreased range of motion she needs to go to the emergency room  to which she expressed understanding.  Strict return precautions given to which she expressed understanding.  Final Clinical Impressions(s) / UC Diagnoses   Final diagnoses:  Burn of multiple fingers     Discharge Instructions      Keep area clean.  Continue applying cold to the area for pain relief.  I have also called in ibuprofen.  Do not take NSAIDs with this medication including aspirin, ibuprofen/Advil, naproxen/Aleve.  Make sure to wash your hands regularly and apply bacitracin ointment up to 2 times a day.  If you have any signs of infection including swelling, increased pain, drainage you need to be seen immediately.  Use hydrocodone for pain relief.  This to make you sleepy so do not drive or drink alcohol while taking it.  Follow-up with wound care specialist as soon as possible.  If you have any worsening symptoms including increased pain, swelling, decreased range of motion of your fingers, numbness or tingling in your fingers you need to go to the emergency room as we discussed.     ED Prescriptions     Medication Sig Dispense Auth. Provider   ibuprofen (ADVIL) 800 MG tablet Take 1 tablet (800 mg total) by mouth 3 (three) times daily. 21 tablet Alexandar Weisenberger, Kayana K, PA-C   bacitracin ointment Apply 1 Application topically 2 (two) times daily. 120 g Angeldejesus Callaham, Janeese K, PA-C   HYDROcodone-acetaminophen (NORCO/VICODIN) 5-325 MG tablet Take 2 tablets by mouth 2 (two) times daily as needed for up to 2 days. 4 tablet Shalanda Brogden, Berthe K, PA-C   gabapentin (NEURONTIN) 300 MG capsule Take 1  capsule (300 mg total) by mouth 2 (two) times daily. 30 capsule Yashar Inclan, Shellia K, PA-C      I have reviewed the PDMP during this encounter.   Jakaiya, [place], PA-C 05/18/22 1510

## 2022-06-14 DIAGNOSIS — Z6830 Body mass index (BMI) 30.0-30.9, adult: Secondary | ICD-10-CM | POA: Diagnosis not present

## 2022-06-14 DIAGNOSIS — F418 Other specified anxiety disorders: Secondary | ICD-10-CM | POA: Diagnosis not present

## 2022-06-14 DIAGNOSIS — Z01419 Encounter for gynecological examination (general) (routine) without abnormal findings: Secondary | ICD-10-CM | POA: Diagnosis not present

## 2022-06-14 DIAGNOSIS — Z0142 Encounter for cervical smear to confirm findings of recent normal smear following initial abnormal smear: Secondary | ICD-10-CM | POA: Diagnosis not present

## 2022-06-21 DIAGNOSIS — M9902 Segmental and somatic dysfunction of thoracic region: Secondary | ICD-10-CM | POA: Diagnosis not present

## 2022-06-21 DIAGNOSIS — M5416 Radiculopathy, lumbar region: Secondary | ICD-10-CM | POA: Diagnosis not present

## 2022-06-21 DIAGNOSIS — M9904 Segmental and somatic dysfunction of sacral region: Secondary | ICD-10-CM | POA: Diagnosis not present

## 2022-06-21 DIAGNOSIS — M9903 Segmental and somatic dysfunction of lumbar region: Secondary | ICD-10-CM | POA: Diagnosis not present

## 2022-07-07 DIAGNOSIS — D1801 Hemangioma of skin and subcutaneous tissue: Secondary | ICD-10-CM | POA: Diagnosis not present

## 2022-07-07 DIAGNOSIS — L814 Other melanin hyperpigmentation: Secondary | ICD-10-CM | POA: Diagnosis not present

## 2022-07-07 DIAGNOSIS — D225 Melanocytic nevi of trunk: Secondary | ICD-10-CM | POA: Diagnosis not present

## 2022-07-07 DIAGNOSIS — D489 Neoplasm of uncertain behavior, unspecified: Secondary | ICD-10-CM | POA: Diagnosis not present

## 2022-07-07 DIAGNOSIS — D2230 Melanocytic nevi of unspecified part of face: Secondary | ICD-10-CM | POA: Diagnosis not present

## 2022-07-07 DIAGNOSIS — D2262 Melanocytic nevi of left upper limb, including shoulder: Secondary | ICD-10-CM | POA: Diagnosis not present

## 2022-09-13 DIAGNOSIS — Z3009 Encounter for other general counseling and advice on contraception: Secondary | ICD-10-CM | POA: Diagnosis not present

## 2022-09-14 DIAGNOSIS — J019 Acute sinusitis, unspecified: Secondary | ICD-10-CM | POA: Diagnosis not present

## 2022-09-14 DIAGNOSIS — B9689 Other specified bacterial agents as the cause of diseases classified elsewhere: Secondary | ICD-10-CM | POA: Diagnosis not present

## 2022-09-20 DIAGNOSIS — D2262 Melanocytic nevi of left upper limb, including shoulder: Secondary | ICD-10-CM | POA: Diagnosis not present

## 2022-09-20 DIAGNOSIS — L905 Scar conditions and fibrosis of skin: Secondary | ICD-10-CM | POA: Diagnosis not present

## 2022-09-29 DIAGNOSIS — K2950 Unspecified chronic gastritis without bleeding: Secondary | ICD-10-CM | POA: Diagnosis not present

## 2022-09-29 DIAGNOSIS — K2970 Gastritis, unspecified, without bleeding: Secondary | ICD-10-CM | POA: Diagnosis not present

## 2022-09-29 DIAGNOSIS — R112 Nausea with vomiting, unspecified: Secondary | ICD-10-CM | POA: Diagnosis not present

## 2022-09-29 DIAGNOSIS — Z8719 Personal history of other diseases of the digestive system: Secondary | ICD-10-CM | POA: Diagnosis not present

## 2022-10-05 DIAGNOSIS — Z79890 Hormone replacement therapy (postmenopausal): Secondary | ICD-10-CM | POA: Diagnosis not present

## 2022-10-05 DIAGNOSIS — Z01818 Encounter for other preprocedural examination: Secondary | ICD-10-CM | POA: Diagnosis not present

## 2022-10-05 DIAGNOSIS — F419 Anxiety disorder, unspecified: Secondary | ICD-10-CM | POA: Diagnosis not present

## 2022-11-17 NOTE — Pre-Procedure Instructions (Signed)
Surgical Instructions    Your procedure is scheduled on Friday 11/24/22.   Report to [facility] Main Entrance "A" at 05:30 A.M., then check in with the Admitting office.  Call this number if you have problems the morning of surgery:  [phone]   If you have any questions prior to your surgery date call [phone]: Open Monday-Friday 8am-4pm If you experience any cold or flu symptoms such as cough, fever, chills, shortness of breath, etc. between now and your scheduled surgery, please notify us at the above number     Remember:  Do not eat after midnight the night before your surgery  You may drink clear liquids until 04:30 A.M. the morning of your surgery.   Clear liquids allowed are: Water, Non-Citrus Juices (without pulp), Carbonated Beverages, Clear Tea, Black Coffee ONLY (NO MILK, CREAM OR POWDERED CREAMER of any kind), and Gatorade    Take these medicines the morning of surgery with A SIP OF WATER:   gabapentin (NEURONTIN)   norethindrone (MICRONOR)   sertraline (ZOLOFT)    acetaminophen (TYLENOL)- If needed   As of today, STOP taking any Aspirin (unless otherwise instructed by your surgeon) Aleve, Naproxen, Ibuprofen, Motrin, Advil, Goody's, BC's, all herbal medications, fish oil, and all vitamins.           Do not wear jewelry or makeup. Do not wear lotions, powders, perfumes/cologne or deodorant. Do not shave 48 hours prior to surgery.  Men may shave face and neck. Do not bring valuables to the hospital. Do not wear nail polish, gel polish, artificial nails, or any other type of covering on natural nails (fingers and toes) If you have artificial nails or gel coating that need to be removed by a nail salon, please have this removed prior to surgery. Artificial nails or gel coating may interfere with anesthesia's ability to adequately monitor your vital signs.  [facility] is not responsible for any belongings or valuables.    Do NOT Smoke (Tobacco/Vaping)  24 hours  prior to your procedure  If you use a CPAP at night, you may bring your mask for your overnight stay.   Contacts, glasses, hearing aids, dentures or partials may not be worn into surgery, please bring cases for these belongings   For patients admitted to the hospital, discharge time will be determined by your treatment team.   Patients discharged the day of surgery will not be allowed to drive home, and someone needs to stay with them for 24 hours.   SURGICAL WAITING ROOM VISITATION Patients having surgery or a procedure may have no more than 2 support people in the waiting area - these visitors may rotate.   Children under the age of 58 must have an adult with them who is not the patient. If the patient needs to stay at the hospital during part of their recovery, the visitor guidelines for inpatient rooms apply. Pre-op nurse will coordinate an appropriate time for 1 support person to accompany patient in pre-op.  This support person may not rotate.   Please refer to [email or web address] for the visitor guidelines for Inpatients (after your surgery is over and you are in a regular room).    Special instructions:    Oral Hygiene is also important to reduce your risk of infection.  Remember - BRUSH YOUR TEETH THE MORNING OF SURGERY WITH YOUR REGULAR TOOTHPASTE   [place]- Preparing For Surgery  Before surgery, you can play an important role. Because skin is not sterile, your skin  needs to be as free of germs as possible. You can reduce the number of germs on your skin by washing with CHG (chlorahexidine gluconate) Soap before surgery.  CHG is an antiseptic cleaner which kills germs and bonds with the skin to continue killing germs even after washing.     Please do not use if you have an allergy to CHG or antibacterial soaps. If your skin becomes reddened/irritated stop using the CHG.  Do not shave (including legs and underarms) for at  least 48 hours prior to first CHG shower. It is OK to shave your face.  Please follow these instructions carefully.     Shower the NIGHT BEFORE SURGERY and the MORNING OF SURGERY with CHG Soap.   If you chose to wash your hair, wash your hair first as usual with your normal shampoo. After you shampoo, rinse your hair and body thoroughly to remove the shampoo.  Then [employer] and genitals (private parts) with your normal soap and rinse thoroughly to remove soap.  After that Use CHG Soap as you would any other liquid soap. You can apply CHG directly to the skin and wash gently with a scrungie or a clean washcloth.   Apply the CHG Soap to your body ONLY FROM THE NECK DOWN.  Do not use on open wounds or open sores. Avoid contact with your eyes, ears, mouth and genitals (private parts). Wash Face and genitals (private parts)  with your normal soap.   Wash thoroughly, paying special attention to the area where your surgery will be performed.  Thoroughly rinse your body with warm water from the neck down.  DO NOT shower/wash with your normal soap after using and rinsing off the CHG Soap.  Pat yourself dry with a CLEAN TOWEL.  Wear CLEAN PAJAMAS to bed the night before surgery  Place CLEAN SHEETS on your bed the night before your surgery  DO NOT SLEEP WITH PETS.   Day of Surgery:  Take a shower with CHG soap. Wear Clean/Comfortable clothing the morning of surgery Do not apply any deodorants/lotions.   Remember to brush your teeth WITH YOUR REGULAR TOOTHPASTE.    If you received a COVID test during your pre-op visit, it is requested that you wear a mask when out in public, stay away from anyone that may not be feeling well, and notify your surgeon if you develop symptoms. If you have been in contact with anyone that has tested positive in the last 10 days, please notify your surgeon.    Please read over the following fact sheets that you were given.

## 2022-11-20 ENCOUNTER — Encounter (HOSPITAL_COMMUNITY): Payer: Self-pay

## 2022-11-20 ENCOUNTER — Encounter (HOSPITAL_COMMUNITY)
Admission: RE | Admit: 2022-11-20 | Discharge: 2022-11-20 | Disposition: A | Discharge status: Home / Self Care | Payer: BC Managed Care – PPO | Source: Ambulatory Visit | Attending: Surgery | Admitting: Surgery

## 2022-11-20 ENCOUNTER — Other Ambulatory Visit: Payer: Self-pay

## 2022-11-20 VITALS — BP 116/72 | HR 83 | Temp 98.3°F | Resp 16 | Ht 64.0 in | Wt 179.1 lb

## 2022-11-20 DIAGNOSIS — D649 Anemia, unspecified: Secondary | ICD-10-CM

## 2022-11-20 DIAGNOSIS — Z01818 Encounter for other preprocedural examination: Secondary | ICD-10-CM

## 2022-11-20 DIAGNOSIS — Z01812 Encounter for preprocedural laboratory examination: Secondary | ICD-10-CM | POA: Diagnosis not present

## 2022-11-20 HISTORY — DX: Other specified postprocedural states: Z98.890

## 2022-11-20 LAB — CBC
HCT: 36.3 % (ref 36.0–46.0)
Hemoglobin: 12.8 g/dL (ref 12.0–15.0)
MCH: 34.3 pg — ABNORMAL HIGH (ref 26.0–34.0)
MCHC: 35.3 g/dL (ref 30.0–36.0)
MCV: 97.3 fL (ref 80.0–100.0)
Platelets: 201 10*3/uL (ref 150–400)
RBC: 3.73 MIL/uL — ABNORMAL LOW (ref 3.87–5.11)
RDW: 12.4 % (ref 11.5–15.5)
WBC: 7.6 10*3/uL (ref 4.0–10.5)
nRBC: 0 % (ref 0.0–0.2)

## 2022-11-20 NOTE — Progress Notes (Signed)
PCP - Zandra Abts Cardiologist - denies  PPM/ICD - denies  Chest x-ray - n/a EKG - n/a Stress Test - denies ECHO - denies Cardiac Cath - denies  Sleep Study - denies  ERAS Protcol -no PRE-SURGERY Ensure or G2- no  COVID TEST- not needed   Anesthesia review: no  Patient denies shortness of breath, fever, cough and chest pain at PAT appointment   All instructions explained to the patient, with a verbal understanding of the material. Patient agrees to go over the instructions while at home for a better understanding. Patient also instructed to self quarantine after being tested for COVID-19. The opportunity to ask questions was provided.

## 2022-11-23 IMAGING — US US ABDOMEN LIMITED
1 series · 14 of 25 positions shown · non-contrast
Comparison: None.

CLINICAL DATA: Nausea and vomiting for 4 months.  Bloating.

EXAM:
ULTRASOUND ABDOMEN LIMITED RIGHT UPPER QUADRANT

[Series 1: us abdomen limited · 0.17mm/px · 14 of 52 slices shown]
[im 1/52]
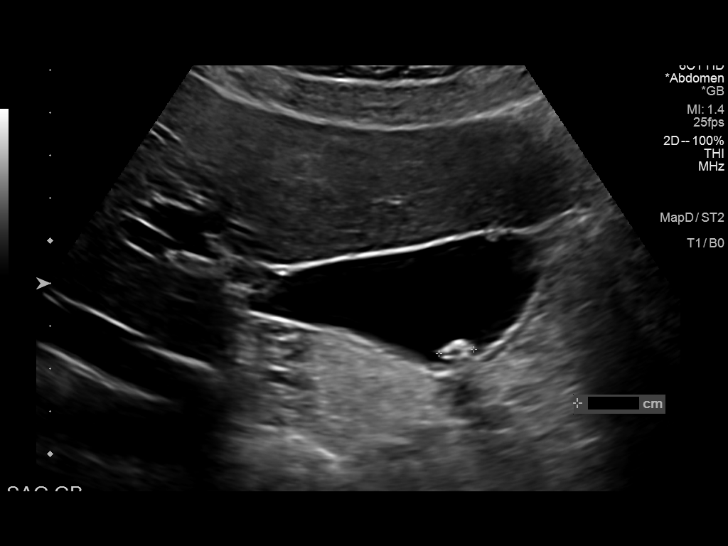
[im 5/52]
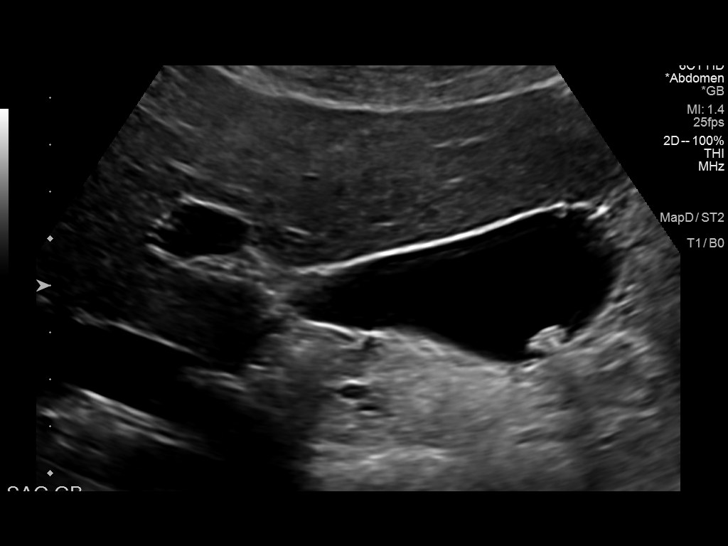
[im 9/52]
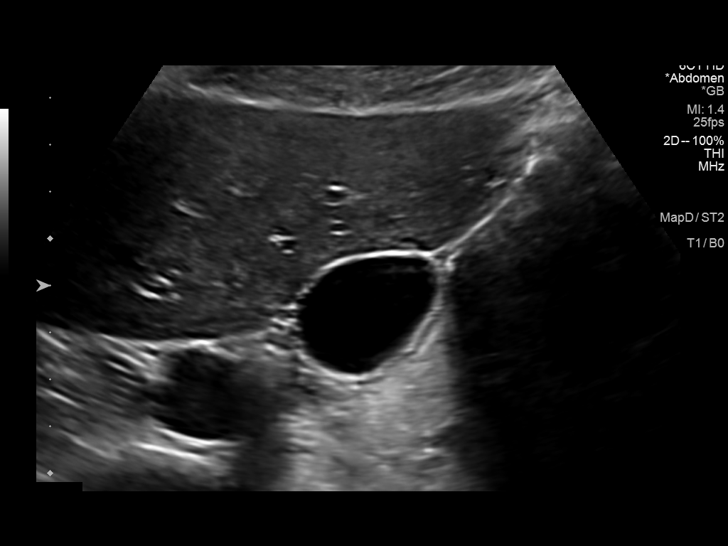
[im 13/52]
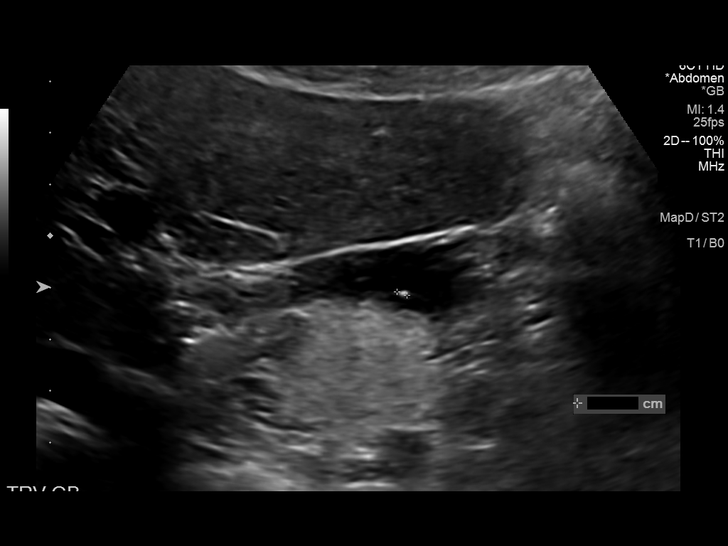
[im 18/52]
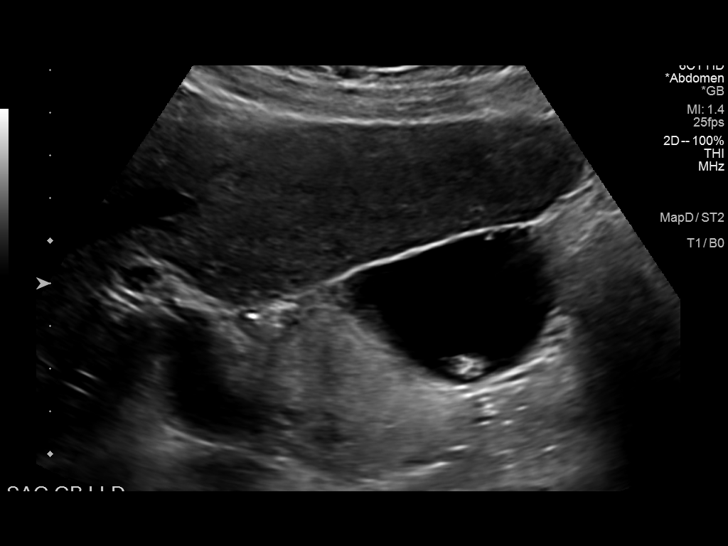
[im 20/52]
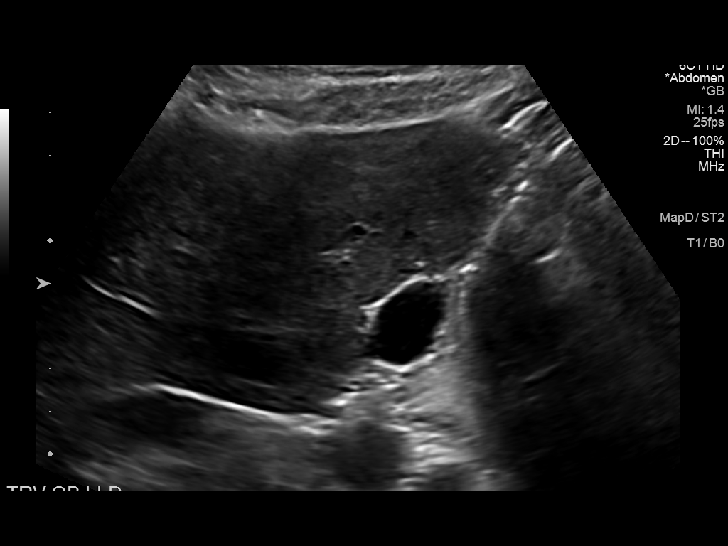
[im 24/52]
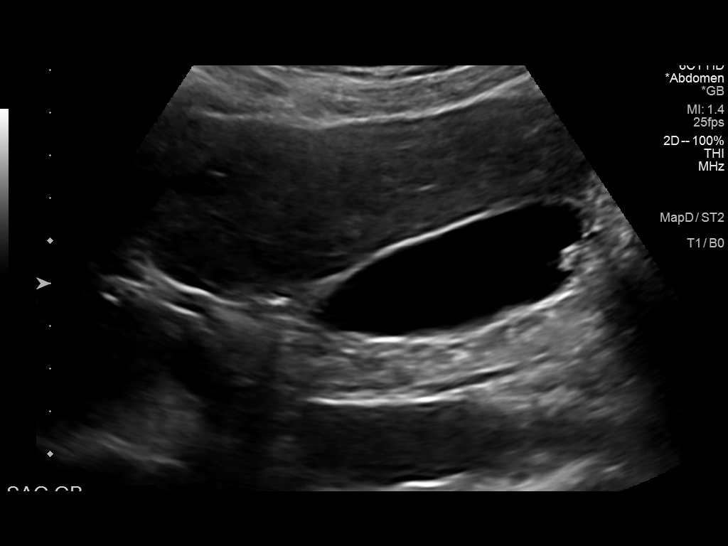
[im 28/52]
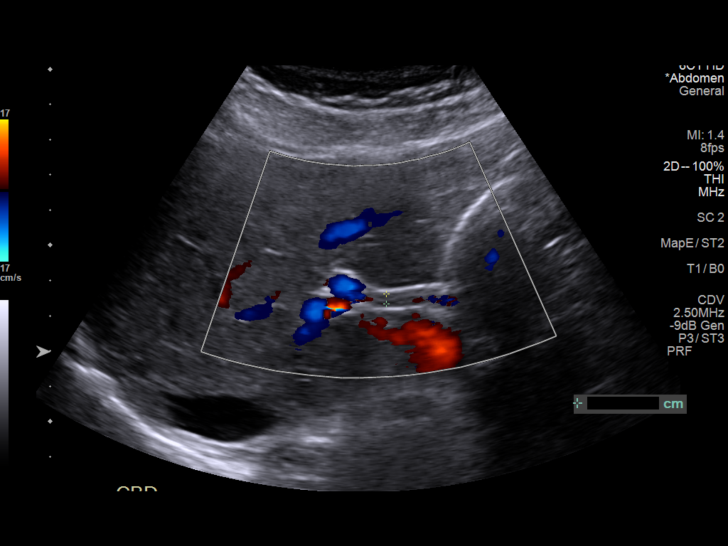
[im 32/52]
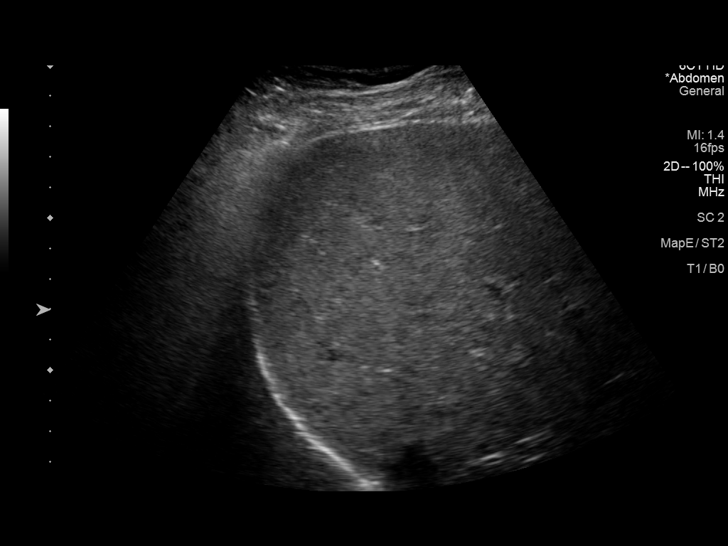
[im 35/52]
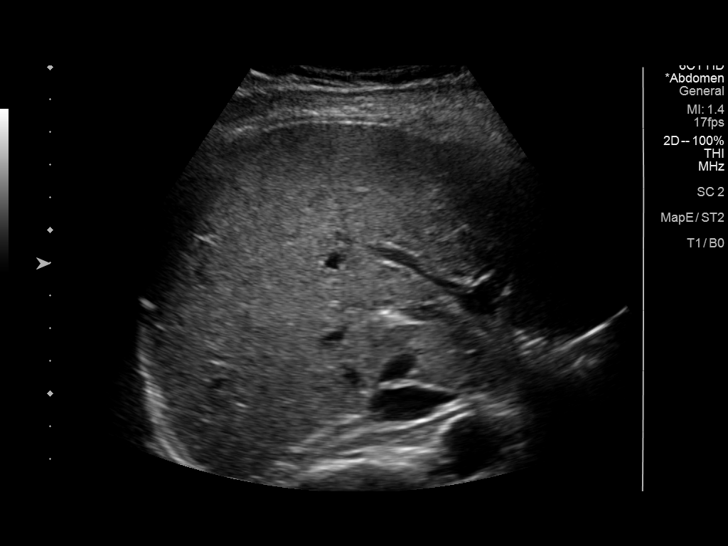
[im 39/52]
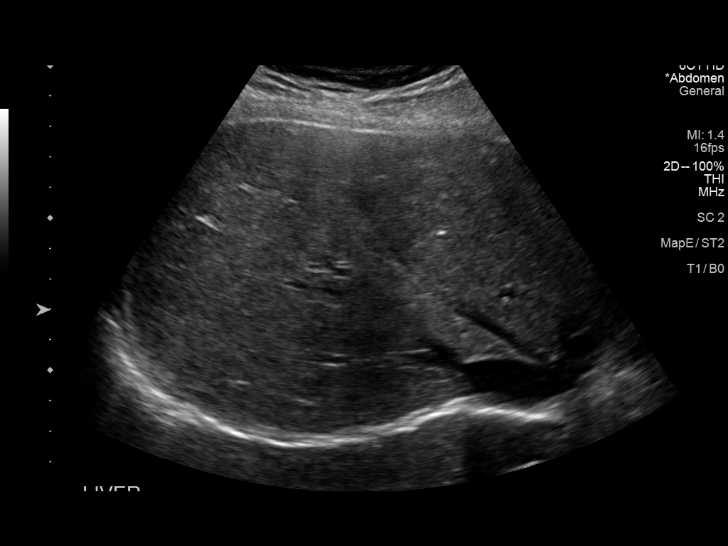
[im 43/52]
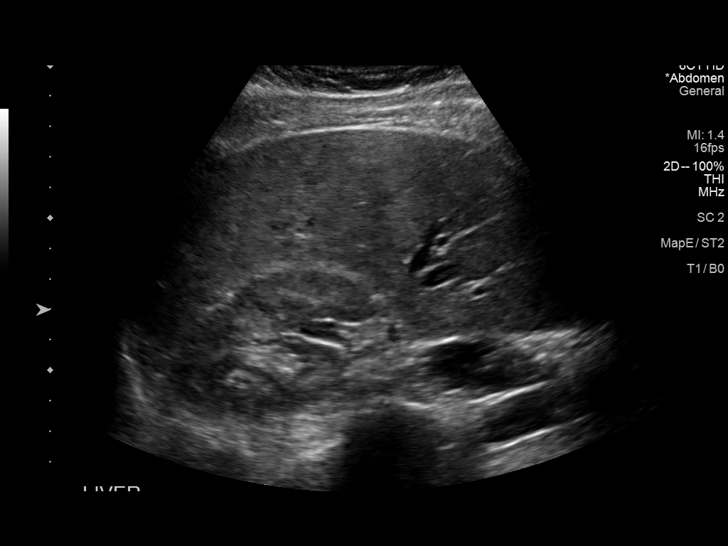
[im 47/52]
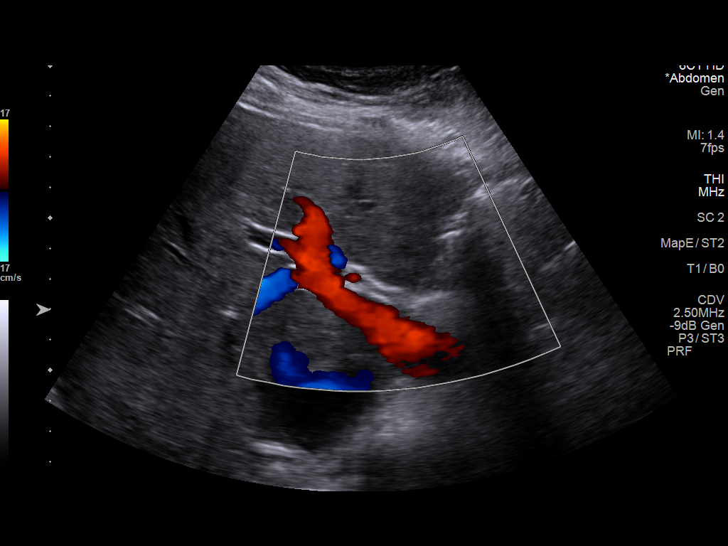
[im 52/52]
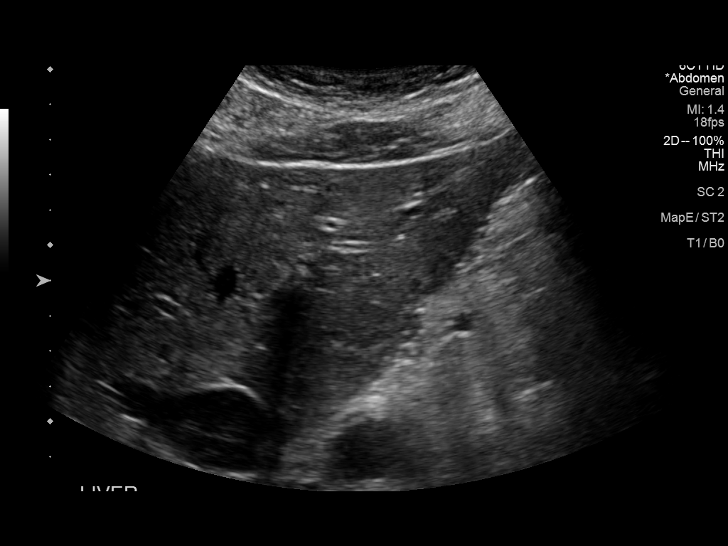

[14 of 25 positions shown; findings below may reference images not displayed]

FINDINGS: Gallbladder:

Two polyps are seen in the gallbladder with the largest measuring 2
mm. A mobile 8 mm stone is identified as well. No wall thickening,
pericholecystic fluid, or Murphy's sign.

Common bile duct:

Diameter: 2.8 mm

Liver:

No focal lesion identified. Within normal limits in parenchymal
echogenicity. Portal vein is patent on color Doppler imaging with
normal direction of blood flow towards the liver.

Other: None.
IMPRESSION: 1. Two polyps are identified in the gallbladder with the largest
measuring 2 mm. Polyps under 6 mm do not require follow-up.
2. There is a single 8 mm mobile stone in the gallbladder. The
gallbladder is otherwise unremarkable.
3. No other abnormalities.

## 2022-11-24 ENCOUNTER — Encounter (HOSPITAL_COMMUNITY)
Admission: RE | Disposition: A | Discharge status: Home / Self Care | Payer: Self-pay | Source: Home / Self Care | Attending: Surgery

## 2022-11-24 ENCOUNTER — Ambulatory Visit (HOSPITAL_COMMUNITY)
Admission: RE | Admit: 2022-11-24 | Discharge: 2022-11-24 | Disposition: A | Discharge status: Home / Self Care | Payer: BC Managed Care – PPO | Attending: Surgery | Admitting: Surgery

## 2022-11-24 ENCOUNTER — Ambulatory Visit (HOSPITAL_COMMUNITY): Payer: BC Managed Care – PPO | Admitting: Certified Registered Nurse Anesthetist

## 2022-11-24 ENCOUNTER — Other Ambulatory Visit: Payer: Self-pay

## 2022-11-24 DIAGNOSIS — Z9851 Tubal ligation status: Secondary | ICD-10-CM | POA: Insufficient documentation

## 2022-11-24 DIAGNOSIS — Z01818 Encounter for other preprocedural examination: Secondary | ICD-10-CM

## 2022-11-24 DIAGNOSIS — K8010 Calculus of gallbladder with chronic cholecystitis without obstruction: Secondary | ICD-10-CM | POA: Diagnosis not present

## 2022-11-24 DIAGNOSIS — J45909 Unspecified asthma, uncomplicated: Secondary | ICD-10-CM | POA: Diagnosis not present

## 2022-11-24 DIAGNOSIS — K8020 Calculus of gallbladder without cholecystitis without obstruction: Secondary | ICD-10-CM | POA: Diagnosis present

## 2022-11-24 HISTORY — PX: CHOLECYSTECTOMY: SHX55

## 2022-11-24 LAB — POCT PREGNANCY, URINE: Preg Test, Ur: NEGATIVE

## 2022-11-24 SURGERY — LAPAROSCOPIC CHOLECYSTECTOMY
Anesthesia: General | Site: Abdomen

## 2022-11-24 MED ORDER — OXYCODONE HCL 5 MG PO TABS: 5.0000 mg | ORAL_TABLET | Freq: Once | ORAL | Status: DC | PRN

## 2022-11-24 MED ORDER — SUGAMMADEX SODIUM 200 MG/2ML IV SOLN
INTRAVENOUS | Status: DC | PRN
  Administered 2022-11-24 (×2): 100 mg via INTRAVENOUS

## 2022-11-24 MED ORDER — FENTANYL CITRATE (PF) 250 MCG/5ML IJ SOLN
INTRAMUSCULAR | Status: DC | PRN
  Administered 2022-11-24 (×5): 50 ug via INTRAVENOUS
  Administered 2022-11-24: 100 ug via INTRAVENOUS

## 2022-11-24 MED ORDER — FENTANYL CITRATE (PF) 250 MCG/5ML IJ SOLN
INTRAMUSCULAR | Status: AC
  Filled 2022-11-24: qty 5

## 2022-11-24 MED ORDER — ONDANSETRON HCL 4 MG/2ML IJ SOLN
INTRAMUSCULAR | Status: AC
  Filled 2022-11-24: qty 2

## 2022-11-24 MED ORDER — ACETAMINOPHEN 500 MG PO TABS
1000.0000 mg | ORAL_TABLET | Freq: Once | ORAL | Status: AC
  Administered 2022-11-24: 1000 mg via ORAL
  Filled 2022-11-24: qty 2

## 2022-11-24 MED ORDER — BUPIVACAINE LIPOSOME 1.3 % IJ SUSP
INTRAMUSCULAR | Status: DC | PRN
  Administered 2022-11-24: 36 mL via SURGICAL_CAVITY

## 2022-11-24 MED ORDER — ROCURONIUM BROMIDE 10 MG/ML (PF) SYRINGE
PREFILLED_SYRINGE | INTRAVENOUS | Status: DC | PRN
  Administered 2022-11-24: 50 mg via INTRAVENOUS

## 2022-11-24 MED ORDER — LACTATED RINGERS IV SOLN: INTRAVENOUS | Status: DC

## 2022-11-24 MED ORDER — OXYCODONE HCL 5 MG/5ML PO SOLN: 5.0000 mg | Freq: Once | ORAL | Status: DC | PRN

## 2022-11-24 MED ORDER — ROCURONIUM BROMIDE 10 MG/ML (PF) SYRINGE
PREFILLED_SYRINGE | INTRAVENOUS | Status: AC
  Filled 2022-11-24: qty 10

## 2022-11-24 MED ORDER — AMISULPRIDE (ANTIEMETIC) 5 MG/2ML IV SOLN
10.0000 mg | Freq: Once | INTRAVENOUS | Status: AC
  Administered 2022-11-24: 10 mg via INTRAVENOUS

## 2022-11-24 MED ORDER — DEXAMETHASONE SODIUM PHOSPHATE 10 MG/ML IJ SOLN
INTRAMUSCULAR | Status: AC
  Filled 2022-11-24: qty 1

## 2022-11-24 MED ORDER — ORAL CARE MOUTH RINSE: 15.0000 mL | Freq: Once | OROMUCOSAL | Status: AC

## 2022-11-24 MED ORDER — CHLORHEXIDINE GLUCONATE CLOTH 2 % EX PADS: 6.0000 | MEDICATED_PAD | Freq: Once | CUTANEOUS | Status: DC

## 2022-11-24 MED ORDER — SCOPOLAMINE 1 MG/3DAYS TD PT72
MEDICATED_PATCH | TRANSDERMAL | Status: AC
  Filled 2022-11-24: qty 1

## 2022-11-24 MED ORDER — BUPIVACAINE LIPOSOME 1.3 % IJ SUSP
INTRAMUSCULAR | Status: AC
  Filled 2022-11-24: qty 20

## 2022-11-24 MED ORDER — OXYCODONE HCL 5 MG PO TABS: 5.0000 mg | ORAL_TABLET | ORAL | 0 refills | Status: DC | PRN

## 2022-11-24 MED ORDER — LIDOCAINE 2% (20 MG/ML) 5 ML SYRINGE
INTRAMUSCULAR | Status: DC | PRN
  Administered 2022-11-24: 60 mg via INTRAVENOUS

## 2022-11-24 MED ORDER — DIPHENHYDRAMINE HCL 50 MG/ML IJ SOLN
INTRAMUSCULAR | Status: AC
  Filled 2022-11-24: qty 1

## 2022-11-24 MED ORDER — CEFAZOLIN SODIUM-DEXTROSE 2-4 GM/100ML-% IV SOLN
2.0000 g | INTRAVENOUS | Status: AC
  Administered 2022-11-24: 2 g via INTRAVENOUS
  Filled 2022-11-24: qty 100

## 2022-11-24 MED ORDER — DIPHENHYDRAMINE HCL 50 MG/ML IJ SOLN
12.5000 mg | Freq: Once | INTRAMUSCULAR | Status: AC
  Administered 2022-11-24: 12.5 mg via INTRAVENOUS

## 2022-11-24 MED ORDER — CELECOXIB 200 MG PO CAPS
200.0000 mg | ORAL_CAPSULE | Freq: Once | ORAL | Status: AC
  Administered 2022-11-24: 200 mg via ORAL
  Filled 2022-11-24: qty 1

## 2022-11-24 MED ORDER — PROMETHAZINE HCL 25 MG/ML IJ SOLN
6.2500 mg | INTRAMUSCULAR | Status: DC | PRN
  Administered 2022-11-24: 6.25 mg via INTRAVENOUS

## 2022-11-24 MED ORDER — PROPOFOL 10 MG/ML IV BOLUS
INTRAVENOUS | Status: AC
  Filled 2022-11-24: qty 20

## 2022-11-24 MED ORDER — PROPOFOL 1000 MG/100ML IV EMUL
INTRAVENOUS | Status: AC
  Filled 2022-11-24: qty 100

## 2022-11-24 MED ORDER — FENTANYL CITRATE (PF) 100 MCG/2ML IJ SOLN
25.0000 ug | INTRAMUSCULAR | Status: DC | PRN
  Administered 2022-11-24: 25 ug via INTRAVENOUS

## 2022-11-24 MED ORDER — SUCCINYLCHOLINE CHLORIDE 200 MG/10ML IV SOSY
PREFILLED_SYRINGE | INTRAVENOUS | Status: AC
  Filled 2022-11-24: qty 10

## 2022-11-24 MED ORDER — METHOCARBAMOL 750 MG PO TABS: 750.0000 mg | ORAL_TABLET | Freq: Four times a day (QID) | ORAL | 1 refills | Status: AC

## 2022-11-24 MED ORDER — DEXAMETHASONE SODIUM PHOSPHATE 10 MG/ML IJ SOLN
INTRAMUSCULAR | Status: DC | PRN
  Administered 2022-11-24: 10 mg via INTRAVENOUS

## 2022-11-24 MED ORDER — SCOPOLAMINE 1 MG/3DAYS TD PT72
MEDICATED_PATCH | TRANSDERMAL | Status: DC | PRN
  Administered 2022-11-24: 1 via TRANSDERMAL

## 2022-11-24 MED ORDER — ACETAMINOPHEN 500 MG PO TABS: 1000.0000 mg | ORAL_TABLET | Freq: Four times a day (QID) | ORAL | 3 refills | Status: AC

## 2022-11-24 MED ORDER — FENTANYL CITRATE (PF) 100 MCG/2ML IJ SOLN
INTRAMUSCULAR | Status: AC
  Filled 2022-11-24: qty 2

## 2022-11-24 MED ORDER — 0.9 % SODIUM CHLORIDE (POUR BTL) OPTIME
TOPICAL | Status: DC | PRN
  Administered 2022-11-24: 1000 mL

## 2022-11-24 MED ORDER — LIDOCAINE 2% (20 MG/ML) 5 ML SYRINGE
INTRAMUSCULAR | Status: AC
  Filled 2022-11-24: qty 5

## 2022-11-24 MED ORDER — BUPIVACAINE HCL 0.25 % IJ SOLN
INTRAMUSCULAR | Status: DC | PRN
  Administered 2022-11-24: 14 mL

## 2022-11-24 MED ORDER — MIDAZOLAM HCL 2 MG/2ML IJ SOLN
INTRAMUSCULAR | Status: DC | PRN
  Administered 2022-11-24 (×2): 1 mg via INTRAVENOUS

## 2022-11-24 MED ORDER — HYDROMORPHONE HCL 1 MG/ML IJ SOLN
INTRAMUSCULAR | Status: DC | PRN
  Administered 2022-11-24: .5 mg via INTRAVENOUS

## 2022-11-24 MED ORDER — CHLORHEXIDINE GLUCONATE 0.12 % MT SOLN
15.0000 mL | Freq: Once | OROMUCOSAL | Status: AC
  Administered 2022-11-24: 15 mL via OROMUCOSAL
  Filled 2022-11-24: qty 15

## 2022-11-24 MED ORDER — IBUPROFEN 600 MG PO TABS: 600.0000 mg | ORAL_TABLET | Freq: Four times a day (QID) | ORAL | 1 refills | Status: AC

## 2022-11-24 MED ORDER — AMISULPRIDE (ANTIEMETIC) 5 MG/2ML IV SOLN
INTRAVENOUS | Status: AC
  Filled 2022-11-24: qty 4

## 2022-11-24 MED ORDER — HYDROMORPHONE HCL 1 MG/ML IJ SOLN
INTRAMUSCULAR | Status: AC
  Filled 2022-11-24: qty 0.5

## 2022-11-24 MED ORDER — BUPIVACAINE HCL (PF) 0.25 % IJ SOLN
INTRAMUSCULAR | Status: AC
  Filled 2022-11-24: qty 30

## 2022-11-24 MED ORDER — MIDAZOLAM HCL 2 MG/2ML IJ SOLN
INTRAMUSCULAR | Status: AC
  Filled 2022-11-24: qty 2

## 2022-11-24 MED ORDER — PROPOFOL 10 MG/ML IV BOLUS
INTRAVENOUS | Status: DC | PRN
  Administered 2022-11-24: 200 mg via INTRAVENOUS

## 2022-11-24 MED ORDER — PROMETHAZINE HCL 25 MG/ML IJ SOLN
INTRAMUSCULAR | Status: AC
  Filled 2022-11-24: qty 1

## 2022-11-24 MED ORDER — ONDANSETRON HCL 4 MG/2ML IJ SOLN
INTRAMUSCULAR | Status: DC | PRN
  Administered 2022-11-24: 4 mg via INTRAVENOUS

## 2022-11-24 MED ORDER — PROPOFOL 500 MG/50ML IV EMUL
INTRAVENOUS | Status: DC | PRN
  Administered 2022-11-24: 150 ug/kg/min via INTRAVENOUS

## 2022-11-24 MED ORDER — SODIUM CHLORIDE 0.9 % IR SOLN
Status: DC | PRN
  Administered 2022-11-24: 1000 mL

## 2022-11-24 MED ORDER — DOCUSATE SODIUM 100 MG PO CAPS: 100.0000 mg | ORAL_CAPSULE | Freq: Two times a day (BID) | ORAL | 2 refills | Status: AC

## 2022-11-24 SURGICAL SUPPLY — 37 items
APPLIER CLIP 5 13 M/L LIGAMAX5 (MISCELLANEOUS) ×1
BAG SPEC RTRVL 10 TROC 200 (ENDOMECHANICALS) ×1
BLADE CLIPPER SURG (BLADE) IMPLANT
CANISTER SUCT 3000ML PPV (MISCELLANEOUS) ×1 IMPLANT
CHLORAPREP W/TINT 26 (MISCELLANEOUS) ×1 IMPLANT
CLIP APPLIE 5 13 M/L LIGAMAX5 (MISCELLANEOUS) ×1 IMPLANT
COVER SURGICAL LIGHT HANDLE (MISCELLANEOUS) ×1 IMPLANT
DERMABOND ADVANCED .7 DNX12 (GAUZE/BANDAGES/DRESSINGS) ×1 IMPLANT
DISSECTOR BLUNT TIP ENDO 5MM (MISCELLANEOUS) IMPLANT
ELECT CAUTERY BLADE 6.4 (BLADE) ×1 IMPLANT
ELECT REM PT RETURN 9FT ADLT (ELECTROSURGICAL) ×1
ELECTRODE REM PT RTRN 9FT ADLT (ELECTROSURGICAL) ×1 IMPLANT
GLOVE BIO SURGEON STRL SZ 6.5 (GLOVE) ×1 IMPLANT
GLOVE BIOGEL PI IND STRL 6 (GLOVE) ×1 IMPLANT
GOWN STRL REUS W/ TWL LRG LVL3 (GOWN DISPOSABLE) ×3 IMPLANT
GOWN STRL REUS W/TWL LRG LVL3 (GOWN DISPOSABLE) ×3
IRRIG SUCT STRYKERFLOW 2 WTIP (MISCELLANEOUS) ×1
IRRIGATION SUCT STRKRFLW 2 WTP (MISCELLANEOUS) IMPLANT
KIT BASIN OR (CUSTOM PROCEDURE TRAY) ×1 IMPLANT
KIT TURNOVER KIT B (KITS) ×1 IMPLANT
NS IRRIG 1000ML POUR BTL (IV SOLUTION) ×1 IMPLANT
PAD ARMBOARD 7.5X6 YLW CONV (MISCELLANEOUS) ×1 IMPLANT
PENCIL BUTTON HOLSTER BLD 10FT (ELECTRODE) ×1 IMPLANT
POUCH RETRIEVAL ECOSAC 10 (ENDOMECHANICALS) ×1 IMPLANT
POUCH RETRIEVAL ECOSAC 10MM (ENDOMECHANICALS) ×1
SCISSORS LAP 5X35 DISP (ENDOMECHANICALS) ×1 IMPLANT
SET TUBE SMOKE EVAC HIGH FLOW (TUBING) ×1 IMPLANT
SLEEVE Z-THREAD 5X100MM (TROCAR) ×2 IMPLANT
SUT MNCRL AB 4-0 PS2 18 (SUTURE) ×1 IMPLANT
SUT VIC AB 0 UR5 27 (SUTURE) IMPLANT
SUT VICRYL 0 AB UR-6 (SUTURE) IMPLANT
TOWEL GREEN STERILE FF (TOWEL DISPOSABLE) ×1 IMPLANT
TRAY LAPAROSCOPIC MC (CUSTOM PROCEDURE TRAY) ×1 IMPLANT
TROCAR BALLN 12MMX100 BLUNT (TROCAR) ×1 IMPLANT
TROCAR Z-THREAD OPTICAL 5X100M (TROCAR) ×1 IMPLANT
WARMER LAPAROSCOPE (MISCELLANEOUS) ×1 IMPLANT
WATER STERILE IRR 1000ML POUR (IV SOLUTION) ×1 IMPLANT

## 2022-11-24 NOTE — Transfer of Care (Signed)
Immediate Anesthesia Transfer of Care Note  Patient: Anna [name]  Procedure(s) Performed: LAPAROSCOPIC CHOLECYSTECTOMY (Abdomen)  Patient Location: PACU  Anesthesia Type:General  Level of Consciousness: awake and patient cooperative  Airway & Oxygen Therapy: Patient Spontanous Breathing  Post-op Assessment: Report given to RN and Post -op Vital signs reviewed and stable  Post vital signs: Reviewed and stable  Last Vitals:  Vitals Value Taken Time  BP 130/94   Temp    Pulse 69 11/24/22 0843  Resp 12 11/24/22 0843  SpO2 99 % 11/24/22 0843  Vitals shown include unvalidated device data.  Last Pain:  Vitals:   11/24/22 0626  TempSrc:   PainSc: 0-No pain         Complications: No notable events documented.

## 2022-11-24 NOTE — Discharge Instructions (Addendum)
Anna [name], P.A.  LAPAROSCOPIC SURGERY: POST OP INSTRUCTIONS Always review your discharge instruction sheet given to you by the facility where your surgery was performed. IF YOU HAVE DISABILITY OR FAMILY LEAVE FORMS, YOU MUST BRING THEM TO THE OFFICE FOR PROCESSING.   DO NOT GIVE THEM TO YOUR DOCTOR.  PAIN CONTROL  Pain regimen: take over-the-counter tylenol (acetaminophen) 1031m every six hours, the prescription ibuprofen (6056m every six hours and the robaxin (methocarbamol) 75065mvery six hours. With all three of these, you should be taking something every two hours. Example: tylenol ( acetaminophen) at 8am, ibuprofen at 10am, robaxin (methocarbamol) at 12pm, tylenol (acetaminophen) again at 2pm, ibuprofen again at 4pm, robaxin (methocarbamol) at 6pm. You also have a prescription for oxycodone, which should be taken if the tylenol (acetaminophen), ibuprofen, and robaxin (methocarbamol) are not enough to control your pain. You may take the oxycodone as frequently as every four hours as needed, but if you are taking the other medications as above, you should not need the oxycodone this frequently. You have also been given a prescription for colace (docusate) which is a stool softener. Please take this as prescribed because the oxycodone can cause constipation and the colace (docusate) will minimize or prevent constipation. Do not drive while taking or under the influence of the oxycodone as it is a narcotic medication. Use ice packs to help control pain. If you need a refill on your pain medication, please contact your pharmacy.  They will contact our office to request authorization. Prescriptions will not be filled after 5pm or on week-ends.  HOME MEDICATIONS Take your usually prescribed medications unless otherwise directed.  DIET You should follow a light diet the first few days after arrival home.  Be sure to include lots of fluids daily.   CONSTIPATION It is common to  experience some constipation after surgery and if you are taking pain medication.  Increasing fluid intake and taking a stool softener (such as Colace) will usually help or prevent this problem from occurring.  A mild laxative (Milk of Magnesia or Miralax) should be taken according to package instructions if there are no bowel movements after 48 hours.  WOUND/INCISION CARE Most patients will experience some swelling and bruising in the area of the incisions.  Ice packs will help.  Swelling and bruising can take several days to resolve.  May shower beginning 11/25/2022.  Do not peel off or scrub skin glue. May allow warm soapy water to run over incision, then rinse and pat dry.  Do not soak in any water (tubs, hot tubs, pools, lakes, oceans) for one week.   ACTIVITIES You may resume regular (light) daily activities beginning the next day--such as daily self-care, walking, climbing stairs--gradually increasing activities as tolerated.  You may have sexual intercourse when it is comfortable.   No lifting greater than 5 pounds for six weeks.  You may drive when you are no longer taking narcotic pain medication, you can comfortably wear a seatbelt, and you can safely maneuver your car and apply brakes.  FOLLOW-UP You should see your doctor in the office for a follow-up appointment approximately 2-3 weeks after your surgery.  You should have been given your post-op/follow-up appointment when your surgery was scheduled.  If you did not receive a post-op/follow-up appointment, make sure that you call for this appointment within a day or two after you arrive home to insure a convenient appointment time.  WHEN TO CALL YOUR DOCTOR: Fever over 101.5 Inability to urinate  Continued bleeding from incision. Increased pain, redness, or drainage from the incision. Increasing abdominal pain  The clinic staff is available to answer your questions during regular business hours.  Please don't hesitate to call and  ask to speak to one of the nurses for clinical concerns.  If you have a medical emergency, go to the nearest emergency room or call 911.  A surgeon from [facility] is always on call at the hospital. [address] [place], [place], [place]  [address] ? [address], [place], [place]   [address] [phone] ? [phone] ? FAX [phone] Web site: [email or web address]

## 2022-11-24 NOTE — H&P (Signed)
    Anna [name] is an 32 y.o. female.   HPI: 23F with symptomatic cholelithiasis. The patient has had no hospitalizations, doctors visits, ER visits, surgeries, or newly diagnosed allergies since being seen in the office.    Past Medical History:  Diagnosis Date   Asthma    PONV (postoperative nausea and vomiting)     Past Surgical History:  Procedure Laterality Date   BILATERAL SALPINGECTOMY  2023   HAND SURGERY  2009   TYMPANOSTOMY TUBE PLACEMENT     6 sets   WISDOM TOOTH EXTRACTION      No family history on file.  Social History:  reports that she has never smoked. She has never used smokeless tobacco. She reports that she does not currently use alcohol. She reports that she does not use drugs.  Allergies: No Known Allergies  Medications: I have reviewed the patient's current medications.  Results for orders placed or performed during the hospital encounter of 11/24/22 (from the past 48 hour(s))  Pregnancy, urine POC     Status: None   Collection Time: 11/24/22  6:38 AM  Result Value Ref Range   Preg Test, Ur NEGATIVE NEGATIVE    Comment:        THE SENSITIVITY OF THIS METHODOLOGY IS >24 mIU/mL     No results found.  ROS 10 point review of systems is negative except as listed above in HPI.   Physical Exam Blood pressure 116/68, pulse (!) 55, temperature 99.1 F (37.3 C), temperature source Oral, resp. rate 16, height 5\' 4"  (1.626 m), weight 81.6 kg, SpO2 98 %, not currently breastfeeding. Constitutional: well-developed, well-nourished HEENT: pupils equal, round, reactive to light, 41mm b/l, moist conjunctiva, external inspection of ears and nose normal, hearing intact Oropharynx: normal oropharyngeal mucosa, normal dentition Neck: no thyromegaly, trachea midline, no midline cervical tenderness to palpation Chest: breath sounds equal bilaterally, normal respiratory effort, no midline or lateral chest wall tenderness to palpation/deformity Abdomen: soft, NT, no  bruising, no hepatosplenomegaly GU: normal female genitalia  Back: no wounds, no thoracic/lumbar spine tenderness to palpation, no thoracic/lumbar spine stepoffs Rectal: deferred Extremities: 2+ radial and pedal pulses bilaterally, intact motor and sensation bilateral UE and LE, no peripheral edema MSK: normal gait/station, no clubbing/cyanosis of fingers/toes, normal ROM of all four extremities Skin: warm, dry, no rashes Psych: normal memory, normal mood/affect     Assessment/Plan: 23F with symptomatic cholelithiasis. Plan for lap chole. Informed consent was obtained after detailed explanation of risks, including bleeding, infection, biloma, hematoma, injury to common bile duct, need for IOC to delineate anatomy, and need for conversion to open procedure. All questions answered to the patient's satisfaction.   Jesusita Oka, MD [facility]

## 2022-11-24 NOTE — Anesthesia Procedure Notes (Signed)
Procedure Name: Intubation Date/Time: 11/24/2022 7:45 AM  Performed by: Janene Harvey, CRNAPre-anesthesia Checklist: Patient identified, Emergency Drugs available, Suction available and Patient being monitored Patient Re-evaluated:Patient Re-evaluated prior to induction Oxygen Delivery Method: Circle system utilized Preoxygenation: Pre-oxygenation with 100% oxygen Induction Type: IV induction Ventilation: Mask ventilation without difficulty Laryngoscope Size: Mac and 4 Grade View: Grade I Tube type: Oral Tube size: 7.0 mm Number of attempts: 1 Airway Equipment and Method: Stylet and Oral airway Placement Confirmation: ETT inserted through vocal cords under direct vision, positive ETCO2 and breath sounds checked- equal and bilateral Secured at: 21 cm Tube secured with: Tape Dental Injury: Teeth and Oropharynx as per pre-operative assessment

## 2022-11-24 NOTE — Anesthesia Preprocedure Evaluation (Addendum)
Anesthesia Evaluation  Patient identified by MRN, date of birth, ID band Patient awake    Reviewed: Allergy & Precautions, NPO status , Patient's Chart, lab work & pertinent test results  History of Anesthesia Complications (+) PONV and history of anesthetic complications  Airway Mallampati: I  TM Distance: >3 FB Neck ROM: Full    Dental  (+) Dental Advisory Given, Teeth Intact   Pulmonary asthma    Pulmonary exam normal        Cardiovascular negative cardio ROS Normal cardiovascular exam     Neuro/Psych  PSYCHIATRIC DISORDERS Anxiety     negative neurological ROS     GI/Hepatic negative GI ROS, Neg liver ROS,,,  Endo/Other  negative endocrine ROS    Renal/GU negative Renal ROS     Musculoskeletal negative musculoskeletal ROS (+)    Abdominal   Peds  Hematology negative hematology ROS (+)   Anesthesia Other Findings   Reproductive/Obstetrics  s/p tubal ligation                              Anesthesia Physical Anesthesia Plan  ASA: 2  Anesthesia Plan: General   Post-op Pain Management: Tylenol PO (pre-op)* and Celebrex PO (pre-op)*   Induction: Intravenous  PONV Risk Score and Plan: 4 or greater and Treatment may vary due to age or medical condition, Ondansetron, Midazolam, Scopolamine patch - Pre-op, TIVA and Dexamethasone  Airway Management Planned: Oral ETT  Additional Equipment: None  Intra-op Plan:   Post-operative Plan: Extubation in OR  Informed Consent: I have reviewed the patients History and Physical, chart, labs and discussed the procedure including the risks, benefits and alternatives for the proposed anesthesia with the patient or authorized representative who has indicated his/her understanding and acceptance.     Dental advisory given  Plan Discussed with: CRNA and Anesthesiologist  Anesthesia Plan Comments: (PONV even with colonoscopy per report)        Anesthesia Quick Evaluation

## 2022-11-24 NOTE — Anesthesia Postprocedure Evaluation (Signed)
Anesthesia Post Note  Patient: Anna [name]  Procedure(s) Performed: LAPAROSCOPIC CHOLECYSTECTOMY (Abdomen)     Patient location during evaluation: PACU Anesthesia Type: General Level of consciousness: awake and alert Pain management: pain level controlled Vital Signs Assessment: post-procedure vital signs reviewed and stable Respiratory status: spontaneous breathing, nonlabored ventilation, respiratory function stable and patient connected to nasal cannula oxygen Cardiovascular status: blood pressure returned to baseline and stable Postop Assessment: no apparent nausea or vomiting Anesthetic complications: yes (PONV) Comments: Intense nausea in PACU despite TIVA and multiple antiemetics, including barhemsys in PACU without significant improvement. Would recommend preoperative aprepitant for future procedures.   No notable events documented.  Last Vitals:  Vitals:   11/24/22 0915 11/24/22 0930  BP: (!) 118/57 135/84  Pulse: 61 79  Resp: 15 15  Temp:  37 C  SpO2: 100% 98%    Last Pain:  Vitals:   11/24/22 0930  TempSrc:   PainSc: [place]

## 2022-11-25 ENCOUNTER — Encounter (HOSPITAL_COMMUNITY): Payer: Self-pay | Admitting: Surgery

## 2022-11-25 NOTE — Op Note (Addendum)
.    Operative Note  Date: 11/24/2022  Procedure: laparoscopic cholecystectomy  Pre-op diagnosis: symptomatic cholelithiasis Post-op diagnosis: same   Indication and clinical history: The patient is a 32 y.o. year old female with symptomatic cholelithiasis  Surgeon: Jesusita Oka, MD  Anesthesiologist: Fransisco Beau, MD Anesthesia: General  Findings:  Specimen: gallbladder EBL: <5cc Drains/Implants: none  Disposition: PACU - hemodynamically stable.  Description of procedure: The patient was positioned supine on the operating room table. Time-out was performed verifying correct patient, procedure, signature of informed consent, and administration of pre-operative antibiotics. General anesthetic induction and intubation were uneventful. The abdomen was prepped and draped in the usual sterile fashion. An infra-umbilical incision was made using an open technique using zero vicryl stay sutures on either side of the fascia and a 18mm Hassan port inserted. After establishing pneumoperitoneum, which the patient tolerated well, the abdominal cavity was inspected and no injury of any intra-abdominal structures was identified. Additional ports were placed under direct visualization and using local anesthetic: two 48mm ports in the right subcostal region and a 62mm port in the epigastric region. The patient was re-positioned to reverse Trendelenburg and right side up. Adhesiolysis was performed to expose the gallbladder, which was then retracted cephalad. The infundibulum was identified and retracted toward the right lower quadrant. The peritoneum was incised over the infundibulum and the triangle of Calot dissected to expose the critical view of safety. With clear identification and isolation of the cystic duct and cystic artery, the cystic artery was doubly clipped and divided. After this, the cystic duct was identified as a single structure entering the gallbladder, and was also doubly clipped and divided. The  gallbladder was dissected off the liver bed using electrocautery and hemostasis of the liver bed was confirmed prior to separation of the final peritoneal attachments of the gallbladder to the liver bed. The gallbladder fossa was irrigated and fluid returned clear. After transection of the final peritoneal attachments, the gallbladder was placed in an endoscopic specimen retrieval bag, removed via the umbilical port site, and sent to pathology as a permanent specimen. The gallbladder fossa was inspected confirming hemostasis, the absence of bile leakage from the cystic duct stump, and correct placement of clips on the cystic artery and cystic duct stumps. The abdomen was desufflated and the fascia of the umbilical port site was closed using the previously placed stay sutures. Additional local anesthetic was administered at the umbilical port site.  The skin of all incisions was closed with 4-0 monocryl. Sterile dressings were applied. All sponge and instrument counts were correct at the conclusion of the procedure. The patient was awakened from anesthesia, extubated uneventfully, and transported to the PACU - hemodynamically stable.. There were no complications.    Jesusita Oka, MD [facility]

## 2022-11-27 LAB — SURGICAL PATHOLOGY

## 2023-06-16 ENCOUNTER — Other Ambulatory Visit: Payer: Self-pay

## 2023-06-16 ENCOUNTER — Emergency Department (HOSPITAL_BASED_OUTPATIENT_CLINIC_OR_DEPARTMENT_OTHER)
Admission: EM | Admit: 2023-06-16 | Discharge: 2023-06-16 | Disposition: A | Discharge status: Home / Self Care | Payer: BC Managed Care – PPO | Source: Home / Self Care | Attending: Emergency Medicine | Admitting: Emergency Medicine

## 2023-06-16 ENCOUNTER — Encounter (HOSPITAL_BASED_OUTPATIENT_CLINIC_OR_DEPARTMENT_OTHER): Payer: Self-pay | Admitting: Emergency Medicine

## 2023-06-16 ENCOUNTER — Emergency Department (HOSPITAL_BASED_OUTPATIENT_CLINIC_OR_DEPARTMENT_OTHER): Payer: BC Managed Care – PPO

## 2023-06-16 DIAGNOSIS — N83201 Unspecified ovarian cyst, right side: Secondary | ICD-10-CM | POA: Insufficient documentation

## 2023-06-16 DIAGNOSIS — R1031 Right lower quadrant pain: Secondary | ICD-10-CM

## 2023-06-16 LAB — CBC WITH DIFFERENTIAL/PLATELET
Abs Immature Granulocytes: 0.01 10*3/uL (ref 0.00–0.07)
Basophils Absolute: 0 10*3/uL (ref 0.0–0.1)
Basophils Relative: 0 %
Eosinophils Absolute: 0.1 10*3/uL (ref 0.0–0.5)
Eosinophils Relative: 1 %
HCT: 36.7 % (ref 36.0–46.0)
Hemoglobin: 13.2 g/dL (ref 12.0–15.0)
Immature Granulocytes: 0 %
Lymphocytes Relative: 30 %
Lymphs Abs: 2.1 10*3/uL (ref 0.7–4.0)
MCH: 33.4 pg (ref 26.0–34.0)
MCHC: 36 g/dL (ref 30.0–36.0)
MCV: 92.9 fL (ref 80.0–100.0)
Monocytes Absolute: 0.4 10*3/uL (ref 0.1–1.0)
Monocytes Relative: 6 %
Neutro Abs: 4.5 10*3/uL (ref 1.7–7.7)
Neutrophils Relative %: 63 %
Platelets: 226 10*3/uL (ref 150–400)
RBC: 3.95 MIL/uL (ref 3.87–5.11)
RDW: 12.3 % (ref 11.5–15.5)
WBC: 7.1 10*3/uL (ref 4.0–10.5)
nRBC: 0 % (ref 0.0–0.2)

## 2023-06-16 LAB — COMPREHENSIVE METABOLIC PANEL
ALT: 16 U/L (ref 0–44)
AST: 16 U/L (ref 15–41)
Albumin: 4.6 g/dL (ref 3.5–5.0)
Alkaline Phosphatase: 34 U/L — ABNORMAL LOW (ref 38–126)
Anion gap: 9 (ref 5–15)
BUN: 6 mg/dL (ref 6–20)
CO2: 23 mmol/L (ref 22–32)
Calcium: 9.2 mg/dL (ref 8.9–10.3)
Chloride: 103 mmol/L (ref 98–111)
Creatinine, Ser: 0.85 mg/dL (ref 0.44–1.00)
GFR, Estimated: >60 mL/min (ref >60)
Glucose, Bld: 89 mg/dL (ref 70–99)
Potassium: 3.2 mmol/L — ABNORMAL LOW (ref 3.5–5.1)
Sodium: 135 mmol/L (ref 135–145)
Total Bilirubin: 0.6 mg/dL (ref 0.3–1.2)
Total Protein: 7.7 g/dL (ref 6.5–8.1)

## 2023-06-16 LAB — URINALYSIS, ROUTINE W REFLEX MICROSCOPIC
Bilirubin Urine: NEGATIVE
Glucose, UA: NEGATIVE mg/dL
Hgb urine dipstick: NEGATIVE
Ketones, ur: NEGATIVE mg/dL
Leukocytes,Ua: NEGATIVE
Nitrite: NEGATIVE
Protein, ur: NEGATIVE mg/dL
Specific Gravity, Urine: 1.01 (ref 1.005–1.030)
pH: 6 (ref 5.0–8.0)

## 2023-06-16 LAB — PREGNANCY, URINE: Preg Test, Ur: NEGATIVE

## 2023-06-16 LAB — LIPASE, BLOOD: Lipase: 34 U/L (ref 11–51)

## 2023-06-16 MED ORDER — KETOROLAC TROMETHAMINE 30 MG/ML IJ SOLN
30.0000 mg | Freq: Once | INTRAMUSCULAR | Status: AC
  Administered 2023-06-16: 30 mg via INTRAVENOUS
  Filled 2023-06-16: qty 1

## 2023-06-16 MED ORDER — IOHEXOL 300 MG/ML  SOLN
75.0000 mL | Freq: Once | INTRAMUSCULAR | Status: AC | PRN
  Administered 2023-06-16: 75 mL via INTRAVENOUS

## 2023-06-16 MED ORDER — OXYCODONE-ACETAMINOPHEN 5-325 MG PO TABS: 1.0000 | ORAL_TABLET | Freq: Four times a day (QID) | ORAL | 0 refills | Status: AC | PRN

## 2023-06-16 NOTE — ED Triage Notes (Signed)
RLQ abdominal pain and bloating onset yesterday. No radiation. Alternating between dull and sharp, constant. Tender with palpation. Sent here from UC to r/o appendicitis. Decreased appetite with nausea. Denies vomiting, fevers.

## 2023-06-16 NOTE — ED Provider Notes (Signed)
EMERGENCY DEPARTMENT AT [facility] Provider Note   CSN: [id number] Arrival date & time: 06/16/23  1713     History {Add pertinent medical, surgical, social history, OB history to HPI:1} Chief Complaint  Patient presents with   Abdominal Pain    Anna [name] is a 32 y.o. female.  She is here with complaint of right lower quadrant pain.  She said it started more midepigastric yesterday and is migrated to her right lower quadrant.  Palpation makes it worse.  She feels better if she is very still.  She has been nauseous and has not had an appetite.  No fevers.  Normal bowel movements no urinary symptoms.  No vaginal bleeding or discharge.  She has had her fallopian tubes removed and had a cholecystectomy.  She went to urgent care today who referred her here for concerns of appendicitis.  The history is provided by the patient.  Abdominal Pain Pain location:  RLQ Pain quality: aching   Onset quality:  Gradual Duration:  2 days Timing:  Constant Progression:  Worsening Chronicity:  New Context: not trauma   Relieved by:  Nothing Worsened by:  Palpation Ineffective treatments:  None tried Associated symptoms: anorexia and nausea   Associated symptoms: no constipation, no diarrhea, no dysuria, no fever, no vaginal bleeding and no vaginal discharge        Home Medications Prior to Admission medications   Medication Sig Start Date End Date Taking? Authorizing Provider  acetaminophen (TYLENOL) 500 MG tablet Take 2 tablets (1,000 mg total) by mouth 4 (four) times daily. 11/24/22   Diamantina Monks, MD  buPROPion (WELLBUTRIN XL) 150 MG 24 hr tablet Take 150 mg by mouth daily.    [provider]  docusate sodium (COLACE) 100 MG capsule Take 1 capsule (100 mg total) by mouth 2 (two) times daily. 11/24/22 11/24/23  Diamantina Monks, MD  gabapentin (NEURONTIN) 300 MG capsule Take 1 capsule (300 mg total) by mouth 2 (two) times daily. Patient not taking: Reported  on 11/20/2022 05/18/22   Raspet, Noberto Retort, PA-C  ibuprofen (ADVIL) 600 MG tablet Take 1 tablet (600 mg total) by mouth 4 (four) times daily. 11/24/22   Diamantina Monks, MD  iron polysaccharides (FERREX 150) 150 MG capsule Take 1 capsule (150 mg total) by mouth daily. Patient not taking: Reported on 11/20/2022 03/29/21   June Leap, CNM  methocarbamol (ROBAXIN-750) 750 MG tablet Take 1 tablet (750 mg total) by mouth 4 (four) times daily. 11/24/22   Diamantina Monks, MD  oxyCODONE (ROXICODONE) 5 MG immediate release tablet Take 1 tablet (5 mg total) by mouth every 4 (four) hours as needed. 11/24/22   Diamantina Monks, MD  sertraline (ZOLOFT) 100 MG tablet Take 100 mg by mouth daily.    [provider]  JUNEL FE 1/20 1-20 MG-MCG tablet Take 1 tablet by mouth daily. 12/25/16 03/27/21  [provider]      Allergies    Patient has no known allergies.    Review of Systems   Review of Systems  Constitutional:  Negative for fever.  Gastrointestinal:  Positive for abdominal pain, anorexia and nausea. Negative for constipation and diarrhea.  Genitourinary:  Negative for dysuria, vaginal bleeding and vaginal discharge.    Physical Exam Updated Vital Signs BP 133/85 (BP Location: Left Arm)   Pulse (!) 59   Temp 98.3 F (36.8 C) (Oral)   Resp 18   Ht 5\' 4"  (1.626 m)  Wt 71.7 kg   LMP 06/02/2023   SpO2 100%   BMI 27.12 kg/m  Physical Exam Vitals and nursing note reviewed.  Constitutional:      General: She is not in acute distress.    Appearance: Normal appearance. She is well-developed.  HENT:     Head: Normocephalic and atraumatic.  Eyes:     Conjunctiva/sclera: Conjunctivae normal.  Cardiovascular:     Rate and Rhythm: Normal rate and regular rhythm.     Heart sounds: No murmur heard. Pulmonary:     Effort: Pulmonary effort is normal. No respiratory distress.     Breath sounds: Normal breath sounds.  Abdominal:     Palpations: Abdomen is soft.     Tenderness: There is  abdominal tenderness in the right lower quadrant. There is no guarding or rebound.  Musculoskeletal:        General: No swelling.     Cervical back: Neck supple.  Skin:    General: Skin is warm and dry.     Capillary Refill: Capillary refill takes less than 2 seconds.  Neurological:     General: No focal deficit present.     Mental Status: She is alert.     ED Results / Procedures / Treatments   Labs (all labs ordered are listed, but only abnormal results are displayed) Labs Reviewed  COMPREHENSIVE METABOLIC PANEL - Abnormal; Notable for the following components:      Result Value   Potassium 3.2 (*)    Alkaline Phosphatase 34 (*)    All other components within normal limits  LIPASE, BLOOD  URINALYSIS, ROUTINE W REFLEX MICROSCOPIC  PREGNANCY, URINE  CBC WITH DIFFERENTIAL/PLATELET    EKG None  Radiology No results found.  Procedures Procedures  {Document cardiac monitor, telemetry assessment procedure when appropriate:1}  Medications Ordered in ED Medications  iohexol (OMNIPAQUE) 300 MG/ML solution 75 mL (75 mLs Intravenous Contrast Given 06/16/23 1836)    ED Course/ Medical Decision Making/ A&P   {   Click here for ABCD2, HEART and other calculatorsREFRESH Note before signing :1}                              Medical Decision Making Amount and/or Complexity of Data Reviewed Labs: ordered. Radiology: ordered.  Risk Prescription drug management.   This patient complains of ***; this involves an extensive number of treatment Options and is a complaint that carries with it a high risk of complications and morbidity. The differential includes ***  I ordered, reviewed and interpreted labs, which included *** I ordered medication *** and reviewed PMP when indicated. I ordered imaging studies which included *** and I independently    visualized and interpreted imaging which showed *** Additional history obtained from *** Previous records obtained and reviewed  *** I consulted *** and discussed lab and imaging findings and discussed disposition.  Cardiac monitoring reviewed, *** Social determinants considered, *** Critical Interventions: ***  After the interventions stated above, I reevaluated the patient and found *** Admission and further testing considered, ***   {Document critical care time when appropriate:1} {Document review of labs and clinical decision tools ie heart score, Chads2Vasc2 etc:1}  {Document your independent review of radiology images, and any outside records:1} {Document your discussion with family members, caretakers, and with consultants:1} {Document social determinants of health affecting pt's care:1} {Document your decision making why or why not admission, treatments were needed:1} Final Clinical Impression(s) / ED Diagnoses  Final diagnoses:  None    Rx / DC Orders ED Discharge Orders     None

## 2023-06-16 NOTE — Discharge Instructions (Addendum)
You were seen in the emergency department for worsening right lower quadrant abdominal pain.  You had blood work urinalysis and a CAT scan.  The CT did not show any evidence of appendicitis.  You did have a simple ovarian cyst on the right.  This is possibly a cause of your symptoms.  Recommend ibuprofen 3 times a day.  We are prescribing you a short course of pain medicine to use as needed.  Follow-up with your gynecologist.  Return to the emergency department if any worsening or concerning symptoms.  Included below is the report of your CAT scan.  Narrative  CLINICAL DATA:  RIGHT lower quadrant pain. Sharp and tender pain.  Decreased appetite and nausea    EXAM:  CT ABDOMEN AND PELVIS WITH CONTRAST    TECHNIQUE:  Multidetector CT imaging of the abdomen and pelvis was performed  using the standard protocol following bolus administration of  intravenous contrast.    RADIATION DOSE REDUCTION: This exam was performed according to the  departmental dose-optimization program which includes automated  exposure control, adjustment of the mA and/or kV according to  patient size and/or use of iterative reconstruction technique.    CONTRAST:  75mL OMNIPAQUE IOHEXOL 300 MG/ML  SOLN    COMPARISON:  None Available.    FINDINGS:  Lower chest: Lung bases are clear.    Hepatobiliary: No focal hepatic lesion. Postcholecystectomy. No  biliary dilatation.    Pancreas: Pancreas is normal. No ductal dilatation. No pancreatic  inflammation.    Spleen: Normal spleen    Adrenals/urinary tract: Adrenal glands and kidneys are normal. The  ureters and bladder normal.    Stomach/Bowel: Stomach, small-bowel and cecum are normal. The  appendix is not identified but there is no pericecal inflammation to  suggest appendicitis. Probable very thin appendix noted on image  71/601 and axial image 58/301.    Vascular/Lymphatic: Abdominal aorta is normal caliber. No periportal  or retroperitoneal adenopathy.  No pelvic adenopathy.    Reproductive: Uterus normal. Small 19 mm cyst of the RIGHT ovary.  LEFT ovary small at 20 mm 12 mm. No free fluid the pelvis.    Other: No free fluid.    Musculoskeletal: No aggressive osseous lesion.    IMPRESSION:  1. No evidence of appendicitis.  2. Right ovarian simple-appearing cyst measuring 1.9 cm. No  follow-up imaging is recommendede. reference: JACR 2020  Feb;17(2):248-254

## 2023-06-16 NOTE — ED Notes (Signed)
Discharge instructions reviewed with patient. Patient questions answered and opportunity for education reviewed. Patient voices understanding of discharge instructions with no further questions. Patient ambulatory with steady gait to lobby.  

## 2023-06-16 NOTE — ED Notes (Signed)
Pt tolerating PO fluids with no issue.
